# Patient Record
Sex: Female | Born: 1975 | Race: Black or African American | Hispanic: No | Marital: Married | Smoking: Never smoker
Health system: Southern US, Community
[De-identification: ages and names within clinical notes are randomized; demographics above are authoritative.]

## PROBLEM LIST (undated history)

## (undated) DIAGNOSIS — Z789 Other specified health status: Secondary | ICD-10-CM

## (undated) HISTORY — PX: GALLBLADDER SURGERY: SHX652

---

## 2019-01-11 ENCOUNTER — Encounter (HOSPITAL_COMMUNITY): Payer: Self-pay | Admitting: *Deleted

## 2019-01-11 ENCOUNTER — Other Ambulatory Visit: Payer: Self-pay

## 2019-01-11 ENCOUNTER — Inpatient Hospital Stay (HOSPITAL_COMMUNITY)
Admission: AD | Admit: 2019-01-11 | Discharge: 2019-01-12 | Disposition: A | Discharge status: Home / Self Care | Payer: No Typology Code available for payment source | Attending: Obstetrics & Gynecology | Admitting: Obstetrics & Gynecology

## 2019-01-11 DIAGNOSIS — O26891 Other specified pregnancy related conditions, first trimester: Secondary | ICD-10-CM | POA: Insufficient documentation

## 2019-01-11 DIAGNOSIS — Z332 Encounter for elective termination of pregnancy: Secondary | ICD-10-CM | POA: Diagnosis not present

## 2019-01-11 DIAGNOSIS — R42 Dizziness and giddiness: Secondary | ICD-10-CM | POA: Diagnosis not present

## 2019-01-11 DIAGNOSIS — Z3A01 Less than 8 weeks gestation of pregnancy: Secondary | ICD-10-CM | POA: Diagnosis not present

## 2019-01-11 DIAGNOSIS — N939 Abnormal uterine and vaginal bleeding, unspecified: Secondary | ICD-10-CM | POA: Insufficient documentation

## 2019-01-11 DIAGNOSIS — O09521 Supervision of elderly multigravida, first trimester: Secondary | ICD-10-CM | POA: Insufficient documentation

## 2019-01-11 HISTORY — DX: Other specified health status: Z78.9

## 2019-01-11 LAB — POCT PREGNANCY, URINE: Preg Test, Ur: POSITIVE — AB

## 2019-01-11 NOTE — MAU Note (Signed)
Took cytotec Weds and THurs am to terminate pregnancy. Tonight started having a lot of vaginal bleeding and states fainted in bathroom LMP 11/12/18

## 2019-01-12 ENCOUNTER — Encounter (HOSPITAL_COMMUNITY): Payer: Self-pay | Admitting: *Deleted

## 2019-01-12 DIAGNOSIS — Z332 Encounter for elective termination of pregnancy: Secondary | ICD-10-CM

## 2019-01-12 DIAGNOSIS — R42 Dizziness and giddiness: Secondary | ICD-10-CM

## 2019-01-12 DIAGNOSIS — N939 Abnormal uterine and vaginal bleeding, unspecified: Secondary | ICD-10-CM

## 2019-01-12 LAB — CBC
HCT: 28.1 % — ABNORMAL LOW (ref 36.0–46.0)
Hemoglobin: 9.6 g/dL — ABNORMAL LOW (ref 12.0–15.0)
MCH: 29.1 pg (ref 26.0–34.0)
MCHC: 34.2 g/dL (ref 30.0–36.0)
MCV: 85.2 fL (ref 80.0–100.0)
Platelets: 213 10*3/uL (ref 150–400)
RBC: 3.3 MIL/uL — ABNORMAL LOW (ref 3.87–5.11)
RDW: 12.4 % (ref 11.5–15.5)
WBC: 8.1 10*3/uL (ref 4.0–10.5)
nRBC: 0 % (ref 0.0–0.2)

## 2019-01-12 LAB — ABO/RH: ABO/RH(D): O POS

## 2019-01-12 MED ORDER — FERROUS SULFATE 325 (65 FE) MG PO TABS: 325.0000 mg | ORAL_TABLET | Freq: Every day | ORAL | 3 refills | Status: AC

## 2019-01-12 MED ORDER — LACTATED RINGERS IV BOLUS
1000.0000 mL | Freq: Once | INTRAVENOUS | Status: AC
  Administered 2019-01-12: 03:00:00 1000 mL via INTRAVENOUS

## 2019-01-12 NOTE — Progress Notes (Signed)
Written and verbal d/c instructions given and understanding voiced. 

## 2019-01-12 NOTE — MAU Provider Note (Signed)
Chief Complaint: Abdominal Pain and Vaginal Bleeding  First Provider Initiated Contact with Patient 01/12/19 0029     SUBJECTIVE HPI: Cindy Flowers is a 43 y.o. [id number] who presents to maternity admissions reporting bleeding after elective abortion. LMP: 11/12/2018 Patient reports missing period in September and reports pregnancy was unplanned and decided to have an abortion. [facility] 1 tablet of a medication (patient not sure of name, likely Mifeprostone). Went back yesterday and took 8 tablets buccally (likely Cytotec). Reports no symptoms after taking first tablet. Reports cramping started after pills yesterday along with clots. Reports feeling lightheaded yesterday evening with some nausea and dizziness. Cramping and bleeding started at 1200 on 10/29. Reports changing pads about every since then (using "small pads"). At 1900, reported heavier bleeding.  She denies vaginal itching/burning, urinary symptoms, h/a, or fever/chills.    Past Medical History:  Diagnosis Date  . Medical history non-contributory    Past Surgical History:  Procedure Laterality Date  . GALLBLADDER SURGERY     Social History   Socioeconomic History  . Marital status: Married    Spouse name: Not on file  . Number of children: Not on file  . Years of education: Not on file  . Highest education level: Not on file  Occupational History  . Not on file  Social Needs  . Financial resource strain: Not on file  . Food insecurity    Worry: Not on file    Inability: Not on file  . Transportation needs    Medical: Not on file    Non-medical: Not on file  Tobacco Use  . Smoking status: Never Smoker  . Smokeless tobacco: Never Used  Substance and Sexual Activity  . Alcohol use: Never    Frequency: Never  . Drug use: Never  . Sexual activity: Not on file  Lifestyle  . Physical activity    Days per week: Not on file    Minutes per session: Not on file  . Stress:  Not on file  Relationships  . Social Musicianconnections    Talks on phone: Not on file    Gets together: Not on file    Attends religious service: Not on file    Active member of club or organization: Not on file    Attends meetings of clubs or organizations: Not on file    Relationship status: Not on file  . Intimate partner violence    Fear of current or ex partner: Not on file    Emotionally abused: Not on file    Physically abused: Not on file    Forced sexual activity: Not on file  Other Topics Concern  . Not on file  Social History Narrative  . Not on file   No current facility-administered medications on file prior to encounter.    Current Outpatient Medications on File Prior to Encounter  Medication Sig Dispense Refill  . ibuprofen (ADVIL) 800 MG tablet Take 800 mg by mouth every 8 (eight) hours as needed.     No Known Allergies  ROS:  Review of Systems All other systems negative unless noted above in HPI.   I have reviewed patient's Past Medical Hx, Surgical Hx, Family Hx, Social Hx, medications and allergies.   Physical Exam   Patient Vitals for the past 24 hrs:  BP Temp Pulse Resp Height Weight  01/11/19 2347 (!) 104/58 98 F (36.7 C) 75 17 5\' 8"  (1.727 m) 67.1 kg  Constitutional: Well-developed, well-nourished female in no acute distress.  Cardiovascular: normal rate Respiratory: normal effort GI: Abd soft, non-tender.  MS: Extremities nontender, no edema, normal ROM Neurologic: Alert and oriented x 4.  GU: Neg CVAT. PELVIC EXAM: Cervix pink and minimally dilated, without lesion, clots and tissue noted and removed with ring forceps, vaginal walls and external genitalia normal  LAB RESULTS Results for orders placed or performed during the hospital encounter of 01/11/19 (from the past 24 hour(s))  Pregnancy, urine POC     Status: Abnormal   Collection Time: 01/11/19 10:58 PM  Result Value Ref Range   Preg Test, Ur POSITIVE (A) NEGATIVE  ABO/Rh     Status:  None   Collection Time: 01/12/19 12:59 AM  Result Value Ref Range   ABO/RH(D) O POS    No rh immune globuloin      NOT A RH IMMUNE GLOBULIN CANDIDATE, PT RH POSITIVE Performed at [facility], [place] [address], [place], [place] [address]   CBC     Status: Abnormal   Collection Time: 01/12/19  1:02 AM  Result Value Ref Range   WBC 8.1 4.0 - 10.5 K/uL   RBC 3.30 (L) 3.87 - 5.11 MIL/uL   Hemoglobin 9.6 (L) 12.0 - 15.0 g/dL   HCT 28.1 (L) 36.0 - 46.0 %   MCV 85.2 80.0 - 100.0 fL   MCH 29.1 26.0 - 34.0 pg   MCHC 34.2 30.0 - 36.0 g/dL   RDW 12.4 11.5 - 15.5 %   Platelets 213 150 - 400 K/uL   nRBC 0.0 0.0 - 0.2 %    --/--/O POS (10/30 0059)  IMAGING No results found.  MAU Management/MDM: Orders Placed This Encounter  Procedures  . CBC  . Orthostatic vital signs  . Pregnancy, urine POC  . ABO/Rh  . Discharge patient Discharge disposition: 01-Home or Self Care; Discharge patient date: 01/12/2019    Meds ordered this encounter  Medications  . lactated ringers bolus 1,000 mL  . ferrous sulfate 325 (65 FE) MG tablet    Sig: Take 1 tablet (325 mg total) by mouth daily with breakfast.    Dispense:  30 tablet    Refill:  3    ASSESSMENT 1. Elective abortion   2. Vaginal bleeding   3. Orthostatic dizziness    Patient with bleeding after receiving Mifepristone and Cytotec over past 2 days. Bleeding appears normal but will verify patient not with acute anemia. Reports that ultrasound was done at the clinic with documented [redacted]w[redacted]d gestational age. As noted above, speculum exam with clots and fetal tissue which was removed and sent to pathology. CBC and Abo/Rh drawn. O positive blood type. Hgb 9.6; no prior for comparison. Patient not feeling nauseous currently, PO encouraged and patient drank juice in MAU; patient felt better after PO intake. Able to ambulate in MAU without feeling dizzy but obtained orthostatic vitals which unable to complete as patient fainted with brief LOC (did  not hit head, nurses assisted to bed prior to completely falling). 1 Liter LR bolus given. Repeat orthostatics positive by pulse only; BP did not drop and patient asymptomatic. Ferrous sulfate prescribed on discharge. Patient felt comfortable going home and given strict return precautions.   D/w Dr. Harolyn Rutherford.   PLAN Discharge home Allergies as of 01/12/2019   No Known Allergies     Medication List    TAKE these medications   ferrous sulfate 325 (65 FE) MG tablet Take 1 tablet (325 mg total)  by mouth daily with breakfast.   ibuprofen 800 MG tablet Commonly known as: ADVIL Take 800 mg by mouth every 8 (eight) hours as needed.      Follow-up Information    [facility] Follow up.   Specialty: Obstetrics and Gynecology Contact information: [address] [place] [address], [address] [id number] [place] [id number] [phone]          Jerilynn Birkenhead, MD [facility] Fellow, [facility] for [facility], [facility] 01/12/2019  3:22 AM

## 2019-01-12 NOTE — MAU Note (Signed)
Pt states she did not faint and fall but felt light headed so she sat down.

## 2019-01-15 LAB — SURGICAL PATHOLOGY

## 2021-05-28 ENCOUNTER — Other Ambulatory Visit: Payer: Self-pay | Admitting: Obstetrics and Gynecology

## 2021-06-13 HISTORY — PX: BREAST BIOPSY: SHX20

## 2021-06-25 ENCOUNTER — Ambulatory Visit
Admission: RE | Admit: 2021-06-25 | Discharge: 2021-06-25 | Disposition: A | Discharge status: Home / Self Care | Payer: 59 | Source: Ambulatory Visit | Attending: Obstetrics and Gynecology | Admitting: Obstetrics and Gynecology

## 2021-06-25 ENCOUNTER — Other Ambulatory Visit: Payer: Self-pay | Admitting: Obstetrics and Gynecology

## 2021-06-25 DIAGNOSIS — R928 Other abnormal and inconclusive findings on diagnostic imaging of breast: Secondary | ICD-10-CM

## 2021-06-25 DIAGNOSIS — N6310 Unspecified lump in the right breast, unspecified quadrant: Secondary | ICD-10-CM

## 2021-07-08 ENCOUNTER — Ambulatory Visit
Admission: RE | Admit: 2021-07-08 | Discharge: 2021-07-08 | Disposition: A | Discharge status: Home / Self Care | Payer: 59 | Source: Ambulatory Visit | Attending: Obstetrics and Gynecology | Admitting: Obstetrics and Gynecology

## 2021-07-08 DIAGNOSIS — N6310 Unspecified lump in the right breast, unspecified quadrant: Secondary | ICD-10-CM

## 2022-05-07 ENCOUNTER — Other Ambulatory Visit: Payer: Self-pay

## 2022-05-07 ENCOUNTER — Emergency Department (HOSPITAL_COMMUNITY): Payer: 59

## 2022-05-07 ENCOUNTER — Encounter (HOSPITAL_COMMUNITY): Payer: Self-pay

## 2022-05-07 ENCOUNTER — Emergency Department (HOSPITAL_COMMUNITY)
Admission: EM | Admit: 2022-05-07 | Discharge: 2022-05-08 | Disposition: A | Discharge status: Home / Self Care | Payer: 59 | Attending: Emergency Medicine | Admitting: Emergency Medicine

## 2022-05-07 DIAGNOSIS — S39012A Strain of muscle, fascia and tendon of lower back, initial encounter: Secondary | ICD-10-CM

## 2022-05-07 DIAGNOSIS — R0602 Shortness of breath: Secondary | ICD-10-CM | POA: Insufficient documentation

## 2022-05-07 DIAGNOSIS — M545 Low back pain, unspecified: Secondary | ICD-10-CM | POA: Insufficient documentation

## 2022-05-07 DIAGNOSIS — R109 Unspecified abdominal pain: Secondary | ICD-10-CM | POA: Insufficient documentation

## 2022-05-07 LAB — CBC WITH DIFFERENTIAL/PLATELET
Abs Immature Granulocytes: 0.01 10*3/uL (ref 0.00–0.07)
Basophils Absolute: 0.1 10*3/uL (ref 0.0–0.1)
Basophils Relative: 1 %
Eosinophils Absolute: 0.2 10*3/uL (ref 0.0–0.5)
Eosinophils Relative: 3 %
HCT: 36.3 % (ref 36.0–46.0)
Hemoglobin: 11.8 g/dL — ABNORMAL LOW (ref 12.0–15.0)
Immature Granulocytes: 0 %
Lymphocytes Relative: 34 %
Lymphs Abs: 2.4 10*3/uL (ref 0.7–4.0)
MCH: 28.4 pg (ref 26.0–34.0)
MCHC: 32.5 g/dL (ref 30.0–36.0)
MCV: 87.3 fL (ref 80.0–100.0)
Monocytes Absolute: 0.7 10*3/uL (ref 0.1–1.0)
Monocytes Relative: 10 %
Neutro Abs: 3.7 10*3/uL (ref 1.7–7.7)
Neutrophils Relative %: 52 %
Platelets: 260 10*3/uL (ref 150–400)
RBC: 4.16 MIL/uL (ref 3.87–5.11)
RDW: 12.3 % (ref 11.5–15.5)
WBC: 7 10*3/uL (ref 4.0–10.5)
nRBC: 0 % (ref 0.0–0.2)

## 2022-05-07 LAB — URINALYSIS, ROUTINE W REFLEX MICROSCOPIC
Bilirubin Urine: NEGATIVE
Glucose, UA: NEGATIVE mg/dL
Hgb urine dipstick: NEGATIVE
Ketones, ur: NEGATIVE mg/dL
Nitrite: NEGATIVE
Protein, ur: NEGATIVE mg/dL
Specific Gravity, Urine: 1.004 — ABNORMAL LOW (ref 1.005–1.030)
pH: 8 (ref 5.0–8.0)

## 2022-05-07 LAB — COMPREHENSIVE METABOLIC PANEL
ALT: 28 U/L (ref 0–44)
AST: 23 U/L (ref 15–41)
Albumin: 3.8 g/dL (ref 3.5–5.0)
Alkaline Phosphatase: 56 U/L (ref 38–126)
Anion gap: 6 (ref 5–15)
BUN: 15 mg/dL (ref 6–20)
CO2: 22 mmol/L (ref 22–32)
Calcium: 8.6 mg/dL — ABNORMAL LOW (ref 8.9–10.3)
Chloride: 107 mmol/L (ref 98–111)
Creatinine, Ser: 0.65 mg/dL (ref 0.44–1.00)
GFR, Estimated: >60 mL/min (ref >60)
Glucose, Bld: 96 mg/dL (ref 70–99)
Potassium: 3.4 mmol/L — ABNORMAL LOW (ref 3.5–5.1)
Sodium: 135 mmol/L (ref 135–145)
Total Bilirubin: 0.5 mg/dL (ref 0.3–1.2)
Total Protein: 7 g/dL (ref 6.5–8.1)

## 2022-05-07 LAB — HCG, QUANTITATIVE, PREGNANCY: hCG, Beta Chain, Quant, S: 2 m[IU]/mL (ref <5)

## 2022-05-07 LAB — LIPASE, BLOOD: Lipase: 43 U/L (ref 11–51)

## 2022-05-07 MED ORDER — ONDANSETRON 4 MG PO TBDP
4.0000 mg | ORAL_TABLET | Freq: Once | ORAL | Status: AC
  Administered 2022-05-07: 4 mg via ORAL
  Filled 2022-05-07: qty 1

## 2022-05-07 MED ORDER — ACETAMINOPHEN 500 MG PO TABS
1000.0000 mg | ORAL_TABLET | Freq: Once | ORAL | Status: AC
  Administered 2022-05-07: 1000 mg via ORAL
  Filled 2022-05-07: qty 2

## 2022-05-07 MED ORDER — IOHEXOL 300 MG/ML  SOLN
100.0000 mL | Freq: Once | INTRAMUSCULAR | Status: AC | PRN
  Administered 2022-05-07: 100 mL via INTRAVENOUS

## 2022-05-07 MED ORDER — SODIUM CHLORIDE 0.9 % IV BOLUS
1000.0000 mL | Freq: Once | INTRAVENOUS | Status: AC
  Administered 2022-05-07: 1000 mL via INTRAVENOUS

## 2022-05-07 NOTE — ED Provider Triage Note (Signed)
Emergency Medicine Provider Triage Evaluation Note  Leeah Loberg , a 47 y.o. female  was evaluated in triage.  Pt complains of abdominal pain that began last night. Describes the pain in her right and lower abdomen and back. Pain is sharp. It is constant. States that she was at work and could not get comfortable sitting or standing. She went to PCP and told her to take ibuprofen with dinner tonight. She states that after dinner she felt even worse. Having nausea without vomiting. Denies fever, diarrhea, dysuria.   Review of Systems  Positive: See above Negative:   Physical Exam  BP 127/82   Pulse 78   Temp (!) 97.5 F (36.4 C) (Oral)   Resp 17   SpO2 95%  Gen:   Awake, no distress   Resp:  Normal effort  MSK:   Moves extremities without difficulty  Other:  Generalized abdominal TTP. Abdomen is soft.   Medical Decision Making  Medically screening exam initiated at 7:57 PM.  Appropriate orders placed.  Oneida Arenas Billey was informed that the remainder of the evaluation will be completed by another provider, this initial triage assessment does not replace that evaluation, and the importance of remaining in the ED until their evaluation is complete.     Mickie Hillier, PA-C 05/07/22 2001

## 2022-05-07 NOTE — ED Triage Notes (Signed)
Pt with onset of back pain last night.  This morning she felt "shaky" and had back pain and went to lie down but went to work.  States at work she was unable to sit still d/t pain.  Pt reports pain radiating around to abdomen.  Went to see her PCP and was given muscle relaxer and told to take this as well as ibuprofen after dinner tonight. Pt reports after taking these the pain was even worse.

## 2022-05-07 NOTE — ED Provider Notes (Signed)
[facility] Provider Note   CSN: [id number] Arrival date & time: 05/07/22  1929     History  Chief Complaint  Patient presents with   Back Pain   Flank Pain    Cindy Flowers is a 47 y.o. female who presented with right-sided low back pain and right flank pain that began last night.  Patient states she took ibuprofen after dinner and the pain got worse.  Patient at work she was unable to sit due to pain and not lying down does not relieve pain.  Patient states back pain is then the low back region in the paraspinal muscles and radiates around her flank into her abdomen and chest which makes her short of breath.  Patient had been prescribed muscle relaxers but had not had the chance to take it yet.  Patient denied recent illness, fevers, nausea/vomiting, dysuria, saddle anesthesia, new onset weakness, changes in sensation, headaches, vision changes, diarrhea, hematuria, urinary or bowel changes   Home Medications Prior to Admission medications   Medication Sig Start Date End Date Taking? Authorizing Provider  ferrous sulfate 325 (65 FE) MG tablet Take 1 tablet (325 mg total) by mouth daily with breakfast. 01/12/19 05/12/19  Fair, Marin Shutter, MD  ibuprofen (ADVIL) 800 MG tablet Take 800 mg by mouth every 8 (eight) hours as needed.    [provider]      Allergies    Patient has no known allergies.    Review of Systems   Review of Systems  Genitourinary:  Positive for flank pain.  Musculoskeletal:  Positive for back pain.  See HPI  Physical Exam Updated Vital Signs BP 127/82   Pulse 78   Temp (!) 97.5 F (36.4 C) (Oral)   Resp 17   SpO2 95%  Physical Exam Vitals and nursing note reviewed.  Constitutional:      General: She is not in acute distress.    Appearance: She is well-developed.  HENT:     Head: Normocephalic and atraumatic.  Eyes:     Conjunctiva/sclera: Conjunctivae normal.  Cardiovascular:      Rate and Rhythm: Normal rate and regular rhythm.     Pulses: Normal pulses.     Heart sounds: No murmur heard.    Comments: 2+ bilateral radial/dorsalis pedis pulses with regular rate  Pulmonary:     Effort: Pulmonary effort is normal. No respiratory distress.     Breath sounds: Normal breath sounds.  Abdominal:     Palpations: Abdomen is soft. There is no mass.     Tenderness: There is abdominal tenderness (generally, described as discomfort). There is no right CVA tenderness, left CVA tenderness, guarding or rebound.  Musculoskeletal:        General: No swelling.     Cervical back: Normal range of motion and neck supple.     Comments: No midline tenderness and no step-off/crepitus/abnormalities palpated Right-sided paraspinal tenderness  Skin:    General: Skin is warm and dry.     Capillary Refill: Capillary refill takes less than 2 seconds.     Comments: No lesions noted or rash  Neurological:     General: No focal deficit present.     Mental Status: She is alert and oriented to person, place, and time.     Comments: Sensation intact distally in all 4 limbs Patient is able to range ankles and knees however this exacerbates back pain  Psychiatric:  Mood and Affect: Mood normal.     ED Results / Procedures / Treatments   Labs (all labs ordered are listed, but only abnormal results are displayed) Labs Reviewed  COMPREHENSIVE METABOLIC PANEL - Abnormal; Notable for the following components:      Result Value   Potassium 3.4 (*)    Calcium 8.6 (*)    All other components within normal limits  CBC WITH DIFFERENTIAL/PLATELET - Abnormal; Notable for the following components:   Hemoglobin 11.8 (*)    All other components within normal limits  LIPASE, BLOOD  URINALYSIS, ROUTINE W REFLEX MICROSCOPIC  HCG, QUANTITATIVE, PREGNANCY    EKG None  Radiology No results found.  Procedures Procedures    Medications Ordered in ED Medications  ondansetron (ZOFRAN-ODT)  disintegrating tablet 4 mg (4 mg Oral Given 05/07/22 2155)  acetaminophen (TYLENOL) tablet 1,000 mg (1,000 mg Oral Given 05/07/22 2155)  sodium chloride 0.9 % bolus 1,000 mL (1,000 mLs Intravenous New Bag/Given 05/07/22 2159)    ED Course/ Medical Decision Making/ A&P                             Medical Decision Making Amount and/or Complexity of Data Reviewed Labs: ordered.   Cindy Flowers 47 y.o. presented today for back pain. Working DDx that I considered at this time includes, but not limited to, cauda equina, muscle strain, nephrolithiasis, pyelonephritis, vertebral fracture.  Review of prior external notes: None  Unique Tests and My Interpretation:  CBC: Unremarkable hCG quantitative: CMP: Unremarkable Lipase: Unremarkable UA: Pending CT abdomen pelvis with contrast: Pending EKG: Sinus 69 bpm with a first-degree heart block, no ST abnormalities noted  Discussion with Independent Historian: Husband  Discussion of Management of Tests: None  Risk: Cannot be determined at this time  Risk Stratification Score: None  R/o DDx: Cannot be determined at this time  Plan: Patient presented for back pain.  Patient appears uncomfortable during exam but not in distress.  Patient's vitals are stable.  Labs and imaging were ordered along with Tylenol and Zofran for symptom management at this time.  Patient's blood pressure was 104/58 in the room and so a liter of fluids were ordered as well.  Patient was signed out to oncoming team at 2215. Patient stable at this time.        Final Clinical Impression(s) / ED Diagnoses Final diagnoses:  None    Rx / DC Orders ED Discharge Orders     None         Elvina Sidle 05/07/22 2215    Charlesetta Shanks, MD 05/11/22 1444

## 2022-05-08 DIAGNOSIS — M545 Low back pain, unspecified: Secondary | ICD-10-CM | POA: Diagnosis not present

## 2022-05-08 MED ORDER — LIDOCAINE 5 % EX PTCH
1.0000 | MEDICATED_PATCH | CUTANEOUS | Status: DC
  Administered 2022-05-08: 1 via TRANSDERMAL
  Filled 2022-05-08: qty 1

## 2022-05-08 MED ORDER — KETOROLAC TROMETHAMINE 15 MG/ML IJ SOLN
15.0000 mg | Freq: Once | INTRAMUSCULAR | Status: AC
  Administered 2022-05-08: 15 mg via INTRAVENOUS
  Filled 2022-05-08: qty 1

## 2022-05-08 NOTE — ED Provider Notes (Signed)
12:15 AM Care assumed from [place], PA-C at change of shift.  In short, patient presenting for right-sided flank pain and low back pain which began last night.  Pain has been unrelieved despite ibuprofen.  She did see her primary care doctor who prescribed muscle relaxers, but she has not yet taken this medication.  I have reviewed the patient's workup in the ED.  She has no leukocytosis or fever.  No criteria for SIRS/sepsis.  Metabolic panel reassuring without electrolyte derangement.  Liver and kidney function preserved.  Urinalysis is notable for bacteriuria, but minimal pyuria.  She denies any urinary symptoms including urgency, frequency, dysuria.  Doubt UTI.  I have viewed and interpreted the patient's CT scan which is notable only for bilateral simple ovarian cysts.  Do not feel this is contributing to the patient's symptoms.  Given reproducible tenderness on exam, likely musculoskeletal strain.  I have counseled the patient on supportive treatment as well as continued primary care follow-up.  Return precautions discussed and provided. Patient discharged in stable condition with no unaddressed concerns.   Results for orders placed or performed during the hospital encounter of 05/07/22  Comprehensive metabolic panel  Result Value Ref Range   Sodium 135 135 - 145 mmol/L   Potassium 3.4 (L) 3.5 - 5.1 mmol/L   Chloride 107 98 - 111 mmol/L   CO2 22 22 - 32 mmol/L   Glucose, Bld 96 70 - 99 mg/dL   BUN 15 6 - 20 mg/dL   Creatinine, Ser 0.65 0.44 - 1.00 mg/dL   Calcium 8.6 (L) 8.9 - 10.3 mg/dL   Total Protein 7.0 6.5 - 8.1 g/dL   Albumin 3.8 3.5 - 5.0 g/dL   AST 23 15 - 41 U/L   ALT 28 0 - 44 U/L   Alkaline Phosphatase 56 38 - 126 U/L   Total Bilirubin 0.5 0.3 - 1.2 mg/dL   GFR, Estimated >60 >60 mL/min   Anion gap 6 5 - 15  Lipase, blood  Result Value Ref Range   Lipase 43 11 - 51 U/L  CBC with Differential  Result Value Ref Range   WBC 7.0 4.0 - 10.5 K/uL   RBC 4.16 3.87 - 5.11  MIL/uL   Hemoglobin 11.8 (L) 12.0 - 15.0 g/dL   HCT 36.3 36.0 - 46.0 %   MCV 87.3 80.0 - 100.0 fL   MCH 28.4 26.0 - 34.0 pg   MCHC 32.5 30.0 - 36.0 g/dL   RDW 12.3 11.5 - 15.5 %   Platelets 260 150 - 400 K/uL   nRBC 0.0 0.0 - 0.2 %   Neutrophils Relative % 52 %   Neutro Abs 3.7 1.7 - 7.7 K/uL   Lymphocytes Relative 34 %   Lymphs Abs 2.4 0.7 - 4.0 K/uL   Monocytes Relative 10 %   Monocytes Absolute 0.7 0.1 - 1.0 K/uL   Eosinophils Relative 3 %   Eosinophils Absolute 0.2 0.0 - 0.5 K/uL   Basophils Relative 1 %   Basophils Absolute 0.1 0.0 - 0.1 K/uL   Immature Granulocytes 0 %   Abs Immature Granulocytes 0.01 0.00 - 0.07 K/uL  Urinalysis, Routine w reflex microscopic -Urine, Clean Catch  Result Value Ref Range   Color, Urine STRAW (A) YELLOW   APPearance CLEAR CLEAR   Specific Gravity, Urine 1.004 (L) 1.005 - 1.030   pH 8.0 5.0 - 8.0   Glucose, UA NEGATIVE NEGATIVE mg/dL   Hgb urine dipstick NEGATIVE NEGATIVE   Bilirubin Urine NEGATIVE  NEGATIVE   Ketones, ur NEGATIVE NEGATIVE mg/dL   Protein, ur NEGATIVE NEGATIVE mg/dL   Nitrite NEGATIVE NEGATIVE   Leukocytes,Ua TRACE (A) NEGATIVE   RBC / HPF 0-5 0 - 5 RBC/hpf   WBC, UA 6-10 0 - 5 WBC/hpf   Bacteria, UA MANY (A) NONE SEEN   Squamous Epithelial / HPF 0-5 0 - 5 /HPF  hCG, quantitative, pregnancy  Result Value Ref Range   hCG, Beta Chain, Quant, S 2 <5 mIU/mL   CT ABDOMEN PELVIS W CONTRAST  Result Date: 05/07/2022 CLINICAL DATA:  Lower abdominal pain. EXAM: CT ABDOMEN AND PELVIS WITH CONTRAST TECHNIQUE: Multidetector CT imaging of the abdomen and pelvis was performed using the standard protocol following bolus administration of intravenous contrast. RADIATION DOSE REDUCTION: This exam was performed according to the departmental dose-optimization program which includes automated exposure control, adjustment of the mA and/or kV according to patient size and/or use of iterative reconstruction technique. CONTRAST:  110m OMNIPAQUE  IOHEXOL 300 MG/ML  SOLN COMPARISON:  None Available. FINDINGS: Lower chest: Mild anterolateral right middle lobe and posterior bibasilar atelectasis is noted. Hepatobiliary: No focal liver abnormality is seen. The gallbladder is not identified. There is no evidence of biliary dilatation. Pancreas: Unremarkable. No pancreatic ductal dilatation or surrounding inflammatory changes. Spleen: Normal in size without focal abnormality. Adrenals/Urinary Tract: Adrenal glands are unremarkable. Kidneys are normal, without renal calculi, focal lesion, or hydronephrosis. Bladder is unremarkable. Stomach/Bowel: Stomach is within normal limits. Appendix appears normal. No evidence of bowel wall thickening, distention, or inflammatory changes. Vascular/Lymphatic: No significant vascular findings are present. No enlarged abdominal or pelvic lymph nodes. Reproductive: Tortuous vessels are seen along the lateral aspects of an otherwise normal appearing uterus. A 3.5 cm x 2.9 cm x 3.6 cm and 1.7 cm x 1.8 cm x 1.4 cm simple cysts are seen within the right adnexa. Additional smaller cystic appearing areas are noted within the left adnexa. Other: No abdominal wall hernia or abnormality. No abdominopelvic ascites. Musculoskeletal: No acute or significant osseous findings. IMPRESSION: 1. Bilateral simple ovarian cysts, right greater than left. Follow-up with nonemergent pelvic ultrasound is recommended. This recommendation follows ACR consensus guidelines: White Paper of the ACR Incidental Findings Committee II on Adnexal Findings. J Am Coll Radiol 2013:10:675-681. 2. Tortuous vessels along the lateral aspects of the uterus which can be seen in the setting of pelvic congestion syndrome. Electronically Signed   By: TVirgina NorfolkM.D.   On: 05/07/2022 23:59      HAntonietta Breach PHershal Coria02/24/24 0113    RQuintella Reichert MD 05/08/22 [phone]

## 2022-05-08 NOTE — Discharge Instructions (Addendum)
Your evaluation in the ED today was reassuring.  Alternate ice and heat to areas of injury 3-4 times per day to limit inflammation and spasm.  Avoid strenuous activity and heavy lifting.  We recommend Aleve twice a day for management of pain in addition to the muscle relaxer prescribed by your primary care doctor.  Do not drive or drink alcohol after taking a muscle relaxer as it may make you drowsy and impair your judgment.  We recommend recheck with your primary care doctor to ensure resolution of symptoms.  Return to the ED for any new or concerning symptoms.

## 2022-07-27 ENCOUNTER — Other Ambulatory Visit: Payer: Self-pay

## 2022-07-27 ENCOUNTER — Emergency Department (HOSPITAL_COMMUNITY): Payer: 59

## 2022-07-27 ENCOUNTER — Encounter (HOSPITAL_COMMUNITY): Payer: Self-pay

## 2022-07-27 ENCOUNTER — Emergency Department (HOSPITAL_COMMUNITY)
Admission: EM | Admit: 2022-07-27 | Discharge: 2022-07-27 | Disposition: A | Discharge status: Home / Self Care | Payer: 59 | Attending: Emergency Medicine | Admitting: Emergency Medicine

## 2022-07-27 DIAGNOSIS — Y92410 Unspecified street and highway as the place of occurrence of the external cause: Secondary | ICD-10-CM | POA: Diagnosis not present

## 2022-07-27 DIAGNOSIS — I517 Cardiomegaly: Secondary | ICD-10-CM | POA: Diagnosis not present

## 2022-07-27 DIAGNOSIS — S20212A Contusion of left front wall of thorax, initial encounter: Secondary | ICD-10-CM | POA: Diagnosis not present

## 2022-07-27 DIAGNOSIS — R0789 Other chest pain: Secondary | ICD-10-CM | POA: Diagnosis present

## 2022-07-27 LAB — CBC
HCT: 37.6 % (ref 36.0–46.0)
Hemoglobin: 12.2 g/dL (ref 12.0–15.0)
MCH: 28 pg (ref 26.0–34.0)
MCHC: 32.4 g/dL (ref 30.0–36.0)
MCV: 86.4 fL (ref 80.0–100.0)
Platelets: 236 10*3/uL (ref 150–400)
RBC: 4.35 MIL/uL (ref 3.87–5.11)
RDW: 12.9 % (ref 11.5–15.5)
WBC: 7 10*3/uL (ref 4.0–10.5)
nRBC: 0 % (ref 0.0–0.2)

## 2022-07-27 LAB — BASIC METABOLIC PANEL
Anion gap: 5 (ref 5–15)
BUN: 9 mg/dL (ref 6–20)
CO2: 25 mmol/L (ref 22–32)
Calcium: 9.1 mg/dL (ref 8.9–10.3)
Chloride: 105 mmol/L (ref 98–111)
Creatinine, Ser: 0.58 mg/dL (ref 0.44–1.00)
GFR, Estimated: >60 mL/min (ref >60)
Glucose, Bld: 114 mg/dL — ABNORMAL HIGH (ref 70–99)
Potassium: 4.2 mmol/L (ref 3.5–5.1)
Sodium: 135 mmol/L (ref 135–145)

## 2022-07-27 LAB — I-STAT BETA HCG BLOOD, ED (MC, WL, AP ONLY): I-stat hCG, quantitative: <5 m[IU]/mL (ref <5)

## 2022-07-27 LAB — TROPONIN I (HIGH SENSITIVITY): Troponin I (High Sensitivity): 3 ng/L (ref <18)

## 2022-07-27 MED ORDER — CYCLOBENZAPRINE HCL 10 MG PO TABS: 10.0000 mg | ORAL_TABLET | Freq: Three times a day (TID) | ORAL | 0 refills | Status: AC | PRN

## 2022-07-27 MED ORDER — IBUPROFEN 600 MG PO TABS: 600.0000 mg | ORAL_TABLET | Freq: Three times a day (TID) | ORAL | 0 refills | Status: AC | PRN
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
## 2022-08-02 ENCOUNTER — Ambulatory Visit (INDEPENDENT_AMBULATORY_CARE_PROVIDER_SITE_OTHER): Payer: 59 | Admitting: Obstetrics and Gynecology

## 2022-08-02 ENCOUNTER — Ambulatory Visit
Admission: RE | Admit: 2022-08-02 | Discharge: 2022-08-02 | Disposition: A | Discharge status: Home / Self Care | Payer: 59 | Source: Ambulatory Visit | Attending: Obstetrics and Gynecology | Admitting: Obstetrics and Gynecology

## 2022-08-02 ENCOUNTER — Other Ambulatory Visit (HOSPITAL_COMMUNITY)
Admission: RE | Admit: 2022-08-02 | Discharge: 2022-08-02 | Disposition: A | Discharge status: Home / Self Care | Payer: 59 | Source: Ambulatory Visit | Attending: Obstetrics and Gynecology | Admitting: Obstetrics and Gynecology

## 2022-08-02 ENCOUNTER — Encounter: Payer: Self-pay | Admitting: Obstetrics and Gynecology

## 2022-08-02 VITALS — BP 115/81 | HR 60 | Ht 67.0 in | Wt 182.0 lb

## 2022-08-02 DIAGNOSIS — Z01419 Encounter for gynecological examination (general) (routine) without abnormal findings: Secondary | ICD-10-CM

## 2022-08-02 DIAGNOSIS — Z1339 Encounter for screening examination for other mental health and behavioral disorders: Secondary | ICD-10-CM

## 2022-08-02 NOTE — Progress Notes (Signed)
Subjective:     Cindy Flowers is a 47 y.o. female P3 with LMP 07/02/22 and BMI 28 here for a routine exam.  Current complaints: none.  Patient reports a monthly period lasting 3-4 days. She denies pelvic pain or abnormal discharge. She is sexually active using nothing for contraception. She would welcome a pregnancy if it were to occur. She denies any urinary incontinence. She reports regular bowel movement.    Gynecologic History No LMP recorded. Contraception: none Last Pap: 2021. Results were: normal Last mammogram: 06/2021. Results were: abnormal with normal biopsy  Obstetric History OB History  Gravida Para Term Preterm AB Living  4 3 3     3   SAB IAB Ectopic Multiple Live Births               # Outcome Date GA Lbr Len/2nd Weight Sex Delivery Anes PTL Lv  4 Gravida           3 Term      Vag-Spont     2 Term      Vag-Spont     1 Term      Vag-Spont      Past Medical History:  Diagnosis Date   Medical history non-contributory    Past Surgical History:  Procedure Laterality Date   GALLBLADDER SURGERY     History reviewed. No pertinent family history. Social History   Tobacco Use   Smoking status: Never   Smokeless tobacco: Never  Vaping Use   Vaping Use: Never used  Substance Use Topics   Alcohol use: Never   Drug use: Never       Review of Systems Pertinent items noted in HPI and remainder of comprehensive ROS otherwise negative.    Objective:  Blood pressure 115/81, pulse 60, height 5\' 7"  (1.702 m), weight 182 lb (82.6 kg), last menstrual period 07/02/2022, unknown if currently breastfeeding.   GENERAL: Well-developed, well-nourished female in no acute distress.  HEENT: Normocephalic, atraumatic. Sclerae anicteric.  NECK: Supple. Normal thyroid.  LUNGS: Clear to auscultation bilaterally.  HEART: Regular rate and rhythm. BREASTS: Symmetric in size. No palpable masses or lymphadenopathy, skin changes, or nipple drainage. ABDOMEN: Soft, nontender,  nondistended. No organomegaly. PELVIC: Normal external female genitalia. Vagina is pink and rugated.  Normal discharge. Normal appearing cervix. Uterus is normal in size. No adnexal mass or tenderness. Chaperone present during the pelvic exam EXTREMITIES: No cyanosis, clubbing, or edema, 2+ distal pulses.     Assessment:    Healthy female exam.    Plan:    Pap smear collected Screening mammogram ordered Health maintenance labs ordered Patient will be contacted with abnormal results Colonoscopy discussed and patient referred to GI Patient advised to take prenatal vitamins in the event of a pregnancy

## 2022-08-02 NOTE — Progress Notes (Signed)
Pt states she had GYN exam last year with pap.  Mammo last year.  Pt would like all std screening.

## 2022-08-03 LAB — COMPREHENSIVE METABOLIC PANEL
ALT: 21 IU/L (ref 0–32)
AST: 18 IU/L (ref 0–40)
Albumin/Globulin Ratio: 1.7 (ref 1.2–2.2)
Albumin: 4.3 g/dL (ref 3.9–4.9)
Alkaline Phosphatase: 71 IU/L (ref 44–121)
BUN/Creatinine Ratio: 13 (ref 9–23)
BUN: 8 mg/dL (ref 6–24)
Bilirubin Total: 0.3 mg/dL (ref 0.0–1.2)
CO2: 22 mmol/L (ref 20–29)
Calcium: 9.1 mg/dL (ref 8.7–10.2)
Chloride: 102 mmol/L (ref 96–106)
Creatinine, Ser: 0.64 mg/dL (ref 0.57–1.00)
Globulin, Total: 2.6 g/dL (ref 1.5–4.5)
Glucose: 84 mg/dL (ref 70–99)
Potassium: 4.1 mmol/L (ref 3.5–5.2)
Sodium: 137 mmol/L (ref 134–144)
Total Protein: 6.9 g/dL (ref 6.0–8.5)
eGFR: 111 mL/min/{1.73_m2} (ref >59)

## 2022-08-03 LAB — CERVICOVAGINAL ANCILLARY ONLY
Chlamydia: NEGATIVE
Comment: NEGATIVE
Comment: NORMAL
Neisseria Gonorrhea: NEGATIVE

## 2022-08-03 LAB — CBC
Hematocrit: 37.7 % (ref 34.0–46.6)
Hemoglobin: 12.4 g/dL (ref 11.1–15.9)
MCH: 28.6 pg (ref 26.6–33.0)
MCHC: 32.9 g/dL (ref 31.5–35.7)
MCV: 87 fL (ref 79–97)
Platelets: 251 10*3/uL (ref 150–450)
RBC: 4.34 x10E6/uL (ref 3.77–5.28)
RDW: 12.6 % (ref 11.7–15.4)
WBC: 5.8 10*3/uL (ref 3.4–10.8)

## 2022-08-03 LAB — TSH: TSH: 3.1 u[IU]/mL (ref 0.450–4.500)

## 2022-08-03 LAB — RPR: RPR Ser Ql: NONREACTIVE

## 2022-08-03 LAB — HEMOGLOBIN A1C
Est. average glucose Bld gHb Est-mCnc: 100 mg/dL
Hgb A1c MFr Bld: 5.1 % (ref 4.8–5.6)

## 2022-08-03 LAB — HEPATITIS B SURFACE ANTIGEN: Hepatitis B Surface Ag: NEGATIVE

## 2022-08-03 LAB — HIV ANTIBODY (ROUTINE TESTING W REFLEX): HIV Screen 4th Generation wRfx: NONREACTIVE

## 2022-08-03 LAB — HEPATITIS C ANTIBODY: Hep C Virus Ab: NONREACTIVE

## 2022-08-03 LAB — LIPID PANEL
Chol/HDL Ratio: 4 ratio (ref 0.0–4.4)
Cholesterol, Total: 214 mg/dL — ABNORMAL HIGH (ref 100–199)
HDL: 54 mg/dL (ref >39)
LDL Chol Calc (NIH): 140 mg/dL — ABNORMAL HIGH (ref 0–99)
Triglycerides: 113 mg/dL (ref 0–149)
VLDL Cholesterol Cal: 20 mg/dL (ref 5–40)

## 2022-08-05 LAB — CYTOLOGY - PAP
Comment: NEGATIVE
Comment: NEGATIVE
Comment: NEGATIVE
HPV 16: NEGATIVE
HPV 18 / 45: NEGATIVE
High risk HPV: POSITIVE — AB

## 2022-08-06 ENCOUNTER — Encounter: Payer: Self-pay | Admitting: Obstetrics & Gynecology

## 2022-08-06 DIAGNOSIS — R87612 Low grade squamous intraepithelial lesion on cytologic smear of cervix (LGSIL): Secondary | ICD-10-CM | POA: Insufficient documentation

## 2022-08-10 ENCOUNTER — Encounter: Payer: Self-pay | Admitting: Family Medicine

## 2022-08-16 ENCOUNTER — Encounter (HOSPITAL_COMMUNITY): Payer: Self-pay | Admitting: Family Medicine

## 2022-08-16 ENCOUNTER — Encounter (HOSPITAL_BASED_OUTPATIENT_CLINIC_OR_DEPARTMENT_OTHER): Payer: Self-pay | Admitting: Family Medicine

## 2022-08-16 ENCOUNTER — Other Ambulatory Visit (HOSPITAL_COMMUNITY): Payer: Self-pay | Admitting: Family Medicine

## 2022-08-16 DIAGNOSIS — R1032 Left lower quadrant pain: Secondary | ICD-10-CM

## 2022-08-17 ENCOUNTER — Ambulatory Visit (HOSPITAL_COMMUNITY)
Admission: RE | Admit: 2022-08-17 | Discharge: 2022-08-17 | Disposition: A | Discharge status: Home / Self Care | Payer: 59 | Source: Ambulatory Visit | Attending: Family Medicine | Admitting: Family Medicine

## 2022-08-17 DIAGNOSIS — R1032 Left lower quadrant pain: Secondary | ICD-10-CM | POA: Diagnosis present

## 2022-09-30 ENCOUNTER — Other Ambulatory Visit (HOSPITAL_COMMUNITY)
Admission: RE | Admit: 2022-09-30 | Discharge: 2022-09-30 | Disposition: A | Discharge status: Home / Self Care | Payer: 59 | Source: Ambulatory Visit | Attending: Obstetrics & Gynecology | Admitting: Obstetrics & Gynecology

## 2022-09-30 ENCOUNTER — Ambulatory Visit (INDEPENDENT_AMBULATORY_CARE_PROVIDER_SITE_OTHER): Payer: 59 | Admitting: Obstetrics & Gynecology

## 2022-09-30 VITALS — BP 122/81 | HR 58 | Ht 69.0 in | Wt 177.1 lb

## 2022-09-30 DIAGNOSIS — R87612 Low grade squamous intraepithelial lesion on cytologic smear of cervix (LGSIL): Secondary | ICD-10-CM

## 2022-09-30 NOTE — Progress Notes (Signed)
Pt presents for Colpo.  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

## 2022-09-30 NOTE — Progress Notes (Signed)
Patient ID: Cindy Flowers, female   DOB: [birth year], 47 y.o.   MRN: [id number]  Chief Complaint  Patient presents with   Procedure    HPI Drinda Belgard is a 47 y.o. female.  [id number] Patient's last menstrual period was 09/24/2022.  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 Previous colposcopy: no. Prior cervical treatment: no treatment.  Past Medical History:  Diagnosis Date   Medical history non-contributory     Past Surgical History:  Procedure Laterality Date   BREAST BIOPSY Right 06/2021   GALLBLADDER SURGERY      No family history on file.  Social History Social History   Tobacco Use   Smoking status: Never   Smokeless tobacco: Never  Vaping Use   Vaping status: Never Used  Substance Use Topics   Alcohol use: Never   Drug use: Never    No Known Allergies  Current Outpatient Medications  Medication Sig Dispense Refill   ferrous sulfate 325 (65 FE) MG tablet Take 1 tablet (325 mg total) by mouth daily with breakfast. 30 tablet 3   No current facility-administered medications for this visit.    Review of Systems Review of Systems  All other systems reviewed and are negative.   Blood pressure 122/81, pulse (!) 58, height 5\' 9"  (1.753 m), weight 177 lb 1.6 oz (80.3 kg), last menstrual period 09/24/2022, not currently breastfeeding.  Physical Exam Physical Exam Vitals and nursing note reviewed. Exam conducted with a chaperone present.  Constitutional:      Appearance: Normal appearance.  Genitourinary:    General: Normal vulva.     Exam position: Lithotomy position.     Vagina: Normal.     Cervix: Normal.  Neurological:     Mental Status: She is alert.  Psychiatric:        Mood and Affect: Mood normal.        Behavior: Behavior normal.   Patient given informed consent, signed copy in the chart, time out was performed.  Placed in lithotomy  position. Cervix viewed with speculum and colposcope after application of acetic acid.   Colposcopy adequate?  yes Acetowhite lesions?yes anterior cervix Punctation?no Mosaicism?  no Abnormal vasculature?  no Biopsies?11 and 12 ECC?yes  COMMENTS: Patient was given post procedure instructions.    Data Reviewed Pap result  Assessment    Procedure Details  The risks and benefits of the procedure and Written informed consent obtained.  Speculum placed in vagina and excellent visualization of cervix achieved, cervix swabbed x 3 with acetic acid solution.    Complications: none.     Plan    Specimens labelled and sent to Pathology. Suspect LSIL and repeat pap in 12 months      Scheryl Darter 09/30/2022, 2:58 PM

## 2022-10-05 LAB — SURGICAL PATHOLOGY

## 2023-01-11 ENCOUNTER — Encounter: Payer: Self-pay | Admitting: Advanced Practice Midwife

## 2023-01-11 ENCOUNTER — Ambulatory Visit (INDEPENDENT_AMBULATORY_CARE_PROVIDER_SITE_OTHER): Payer: 59 | Admitting: Advanced Practice Midwife

## 2023-01-11 VITALS — BP 118/80 | HR 76 | Ht 69.0 in | Wt 180.0 lb

## 2023-01-11 DIAGNOSIS — R102 Pelvic and perineal pain: Secondary | ICD-10-CM | POA: Diagnosis not present

## 2023-01-11 DIAGNOSIS — R87612 Low grade squamous intraepithelial lesion on cytologic smear of cervix (LGSIL): Secondary | ICD-10-CM

## 2023-01-11 NOTE — Progress Notes (Signed)
Pt c/o pelvic pain for 1 week and irregular periods

## 2023-01-11 NOTE — Progress Notes (Signed)
   GYNECOLOGY PROGRESS NOTE  History:  47 y.o. [id number] presents to [facility] office today for problem gyn visit. She reports intermittent sharp lower abdomin pain on the left side x 1 month. She also had 2 periods this month, which is unusual.   She denies h/a, dizziness, shortness of breath, n/v, or fever/chills.    The following portions of the patient's history were reviewed and updated as appropriate: allergies, current medications, past family history, past medical history, past social history, past surgical history and problem list. Last pap smear on 08/02/22 was abnormal, LSIL with positive HPV, negative 16/18/45. Colpo with CIN 1. Repeat in 1 year per [facility].  Health Maintenance Due  Topic Date Due   DTaP/Tdap/Td (1 - Tdap) Never done   Colonoscopy  Never done   INFLUENZA VACCINE  10/14/2022   COVID-19 Vaccine (1 - 2023-24 season) Never done     Review of Systems:  Pertinent items are noted in HPI.   Objective:  Physical Exam Blood pressure 118/80, pulse 76, height 5\' 9"  (1.753 m), weight 180 lb (81.6 kg), last menstrual period 01/09/2023. VS reviewed, nursing note reviewed,  Constitutional: well developed, well nourished, no distress HEENT: normocephalic CV: normal rate Pulm/chest wall: normal effort Breast Exam: deferred Abdomen: soft Neuro: alert and oriented x 3 Skin: warm, dry Psych: affect normal Pelvic exam: Cervix pink, visually closed, without lesion, scant white creamy discharge, vaginal walls and external genitalia normal Bimanual exam: Cervix 0/long/high, firm, anterior, neg CMT, uterus nontender, nonenlarged, adnexa without tenderness, enlargement, or mass  Assessment & Plan:  1. 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 Acute pelvic pain, female --Possible anovulatory cycle with irregular menses, will evaluate for cyst or other structural cause for pain.    - US PELVIC COMPLETE WITH TRANSVAGINAL; Future   Return for Follow up based on [place] results.   Sharen Counter, CNM 5:16 PM

## 2023-01-20 ENCOUNTER — Ambulatory Visit (HOSPITAL_BASED_OUTPATIENT_CLINIC_OR_DEPARTMENT_OTHER): Payer: 59

## 2023-01-24 ENCOUNTER — Ambulatory Visit (HOSPITAL_BASED_OUTPATIENT_CLINIC_OR_DEPARTMENT_OTHER)
Admission: RE | Admit: 2023-01-24 | Discharge: 2023-01-24 | Disposition: A | Discharge status: Home / Self Care | Payer: 59 | Source: Ambulatory Visit | Attending: Advanced Practice Midwife | Admitting: Advanced Practice Midwife

## 2023-01-24 DIAGNOSIS — R102 Pelvic and perineal pain: Secondary | ICD-10-CM | POA: Insufficient documentation

## 2023-03-14 IMAGING — MG MM BREAST BX W/ LOC DEV 1ST LESION IMAGE BX SPEC STEREO GUIDE*R*
8 of 12 series · 8 of 24 positions shown · non-contrast
Comparison: Previous exams.
COMPARISON: Previous exams.

Addendum:
CLINICAL DATA: Patient presents for stereotactic core needle biopsy
of a right breast asymmetry.

EXAM:
RIGHT BREAST STEREOTACTIC CORE NEEDLE BIOPSY

[R (1 of 8)]
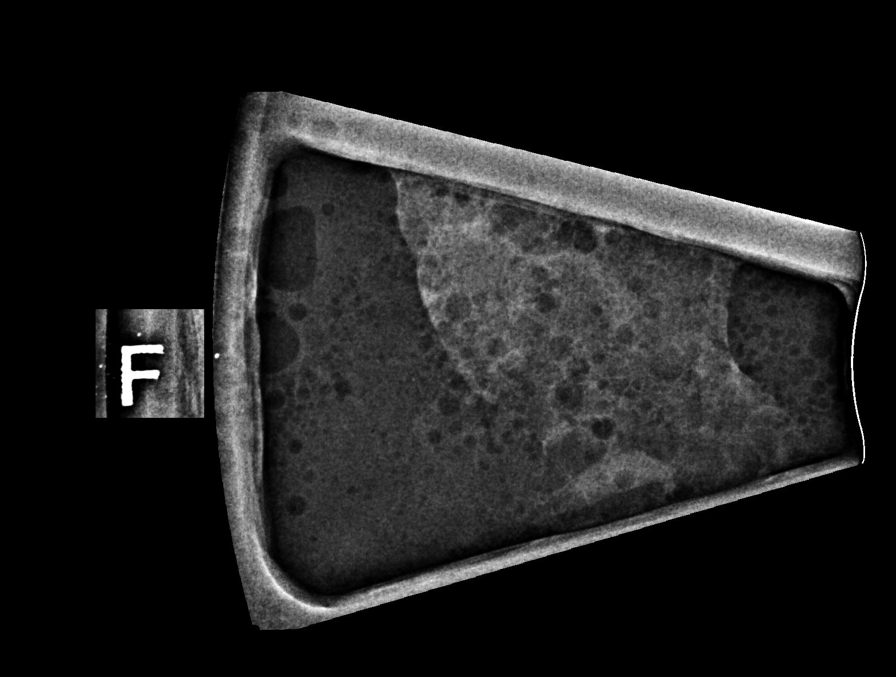

[R (2 of 8)]
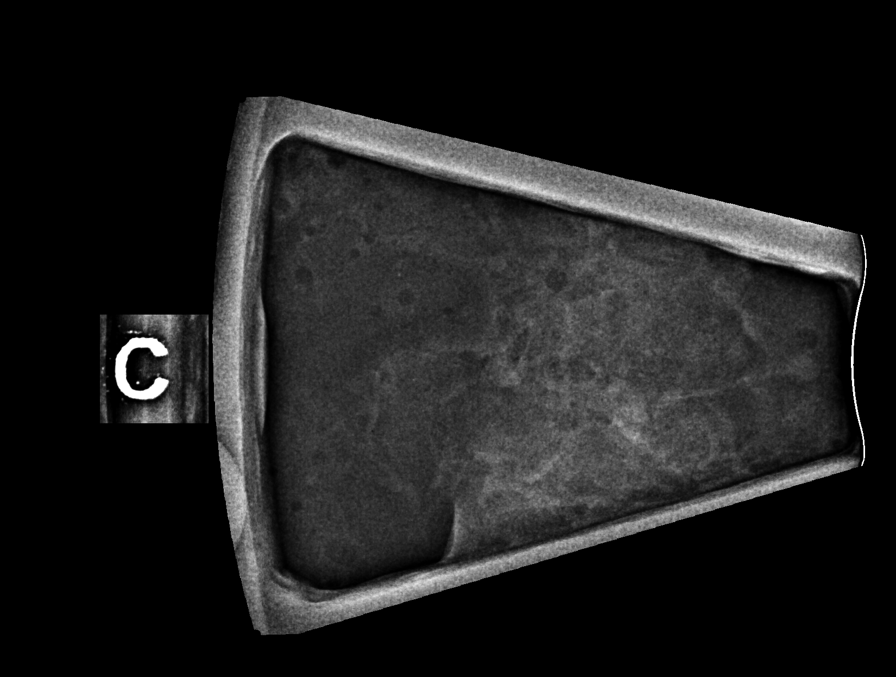

[R (3 of 8)]
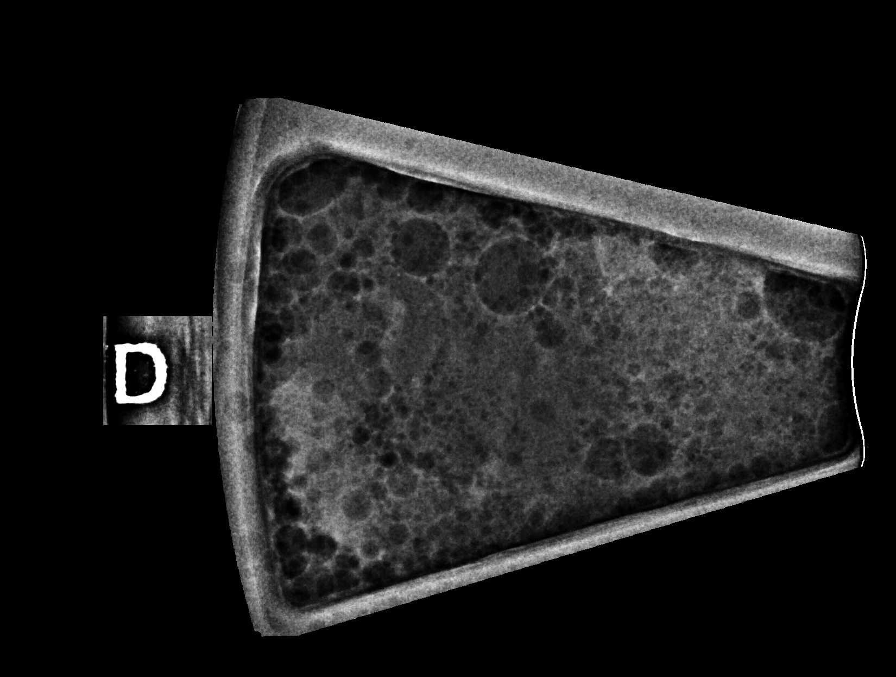

[R (4 of 8)]
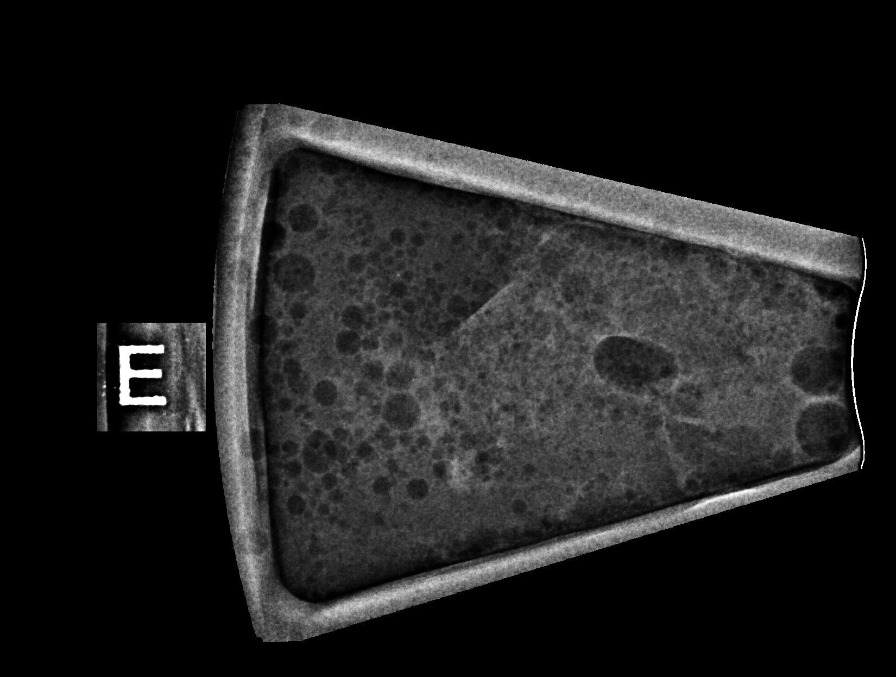

[R (5 of 8)]
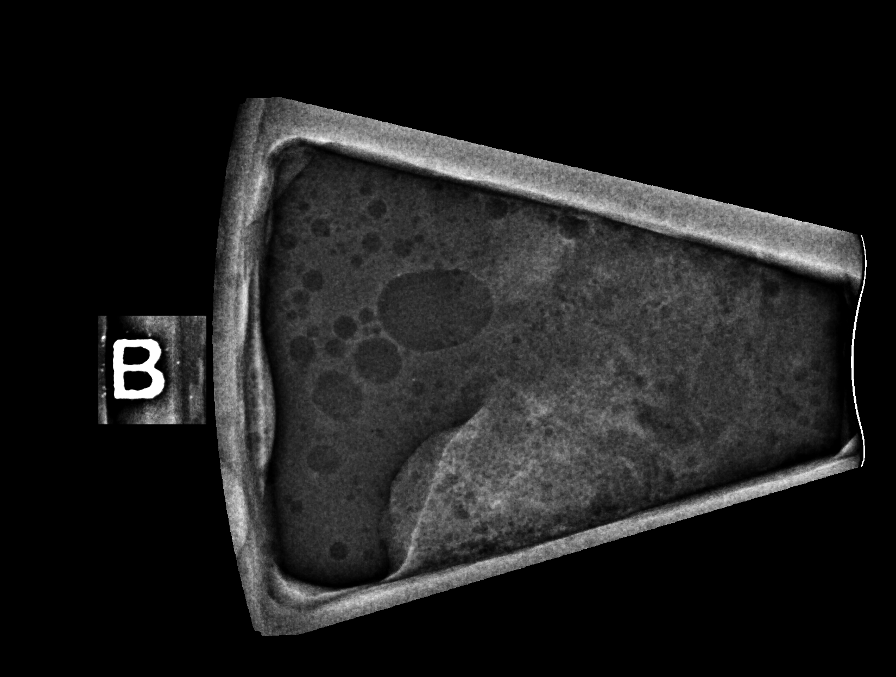

[R (6 of 8)]
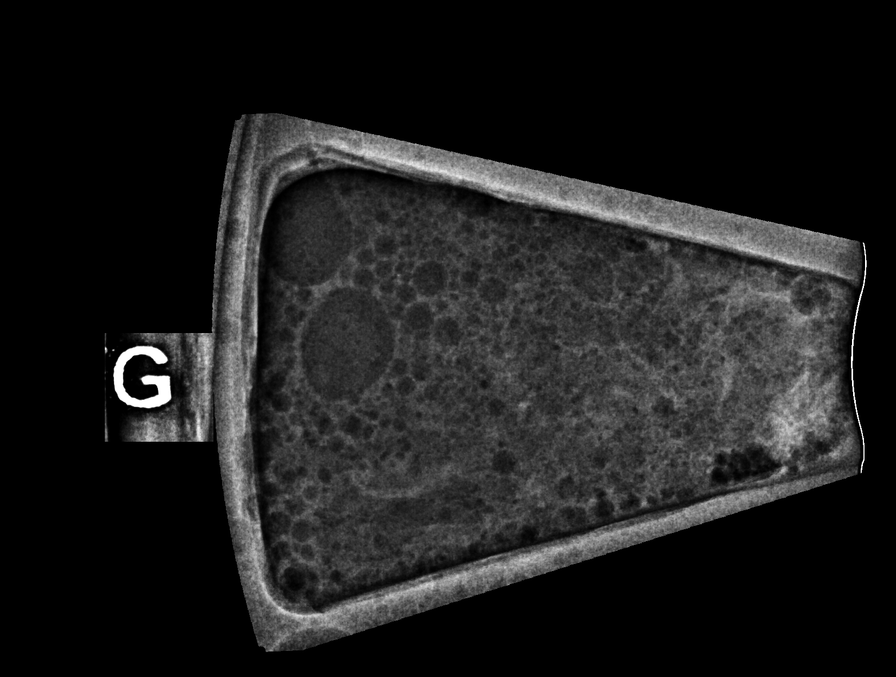

[R (7 of 8)]
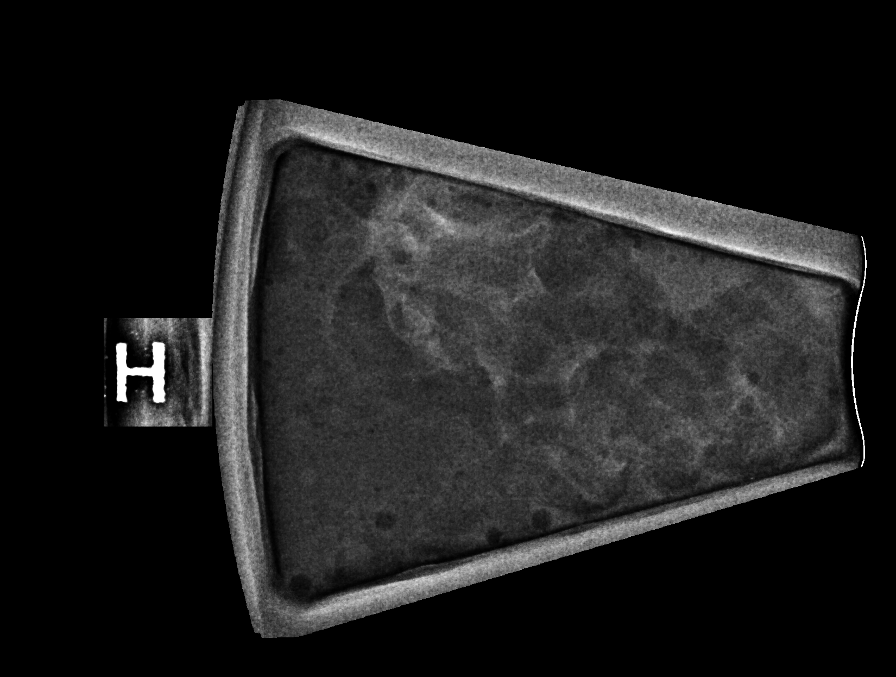

[R (8 of 8)]
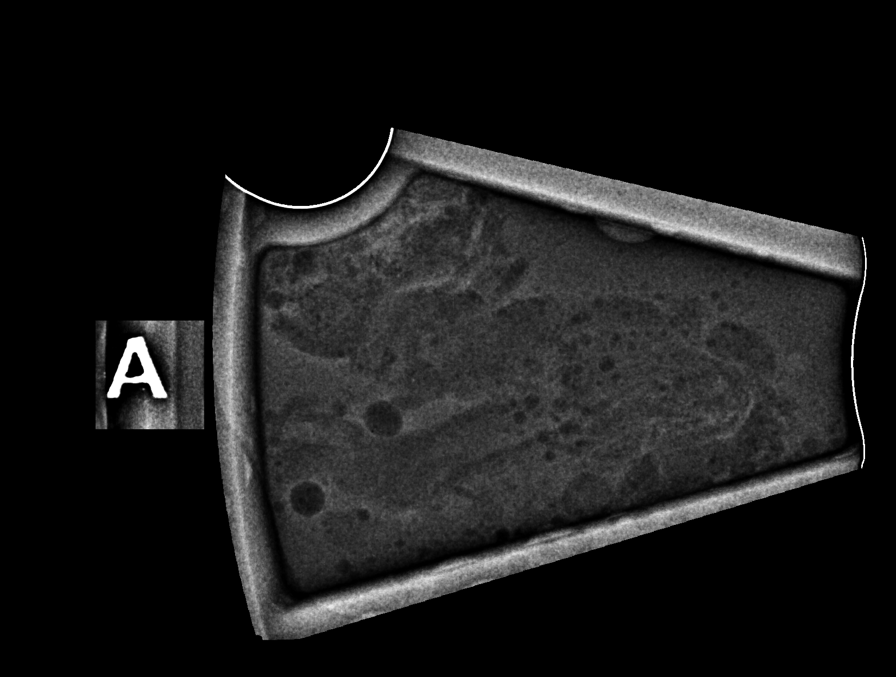

[8 of 24 positions shown; findings below may reference images not displayed]



Using sterile technique and 1% Lidocaine as local anesthetic, under
stereotactic guidance, a 9 gauge vacuum assisted device was used to
perform core needle biopsy of the asymmetry in the far upper outer
quadrant right breast using a MLO approach.

Lesion quadrant: Upper outer quadrant

At the conclusion of the procedure, a coil shaped tissue marker clip
was deployed into the biopsy cavity. Follow-up 2-view mammogram was
performed and dictated separately.
IMPRESSION: Stereotactic-guided biopsy of a right breast asymmetry. No apparent
complications.

ADDENDUM:
Pathology revealed FIBROADENOMATOID CHANGES AND PSEUDOANGIOMATOUS
STROMAL HYPERPLASIA of the RIGHT breast, upper outer quadrant, (coil
clip). This was found to be concordant by Dr. Janchiv Zedug.

Pathology results were discussed with the patient by telephone. The
patient reported doing well after the biopsy with tenderness at the
site. Post biopsy instructions and care were reviewed and questions
were answered. The patient was encouraged to call The [REDACTED]

The patient was instructed to return for annual screening
mammography.

Pathology results reported by Nandehamo Leevi, RN on 07/09/2021.



Using sterile technique and 1% Lidocaine as local anesthetic, under
stereotactic guidance, a 9 gauge vacuum assisted device was used to
perform core needle biopsy of the asymmetry in the far upper outer
quadrant right breast using a MLO approach.

Lesion quadrant: Upper outer quadrant

At the conclusion of the procedure, a coil shaped tissue marker clip
was deployed into the biopsy cavity. Follow-up 2-view mammogram was
performed and dictated separately.
IMPRESSION: Stereotactic-guided biopsy of a right breast asymmetry. No apparent
complications.

## 2023-03-14 IMAGING — MG MM BREAST LOCALIZATION CLIP
2 series · 3 of 6 positions shown · non-contrast
Comparison: Previous exam(s).

CLINICAL DATA: Evaluate post biopsy marker clip placement following
stereotactic core needle biopsy of a right breast asymmetry.

EXAM:
3D DIAGNOSTIC RIGHT MAMMOGRAM POST STEREOTACTIC BIOPSY

[R MLO synth-2D]
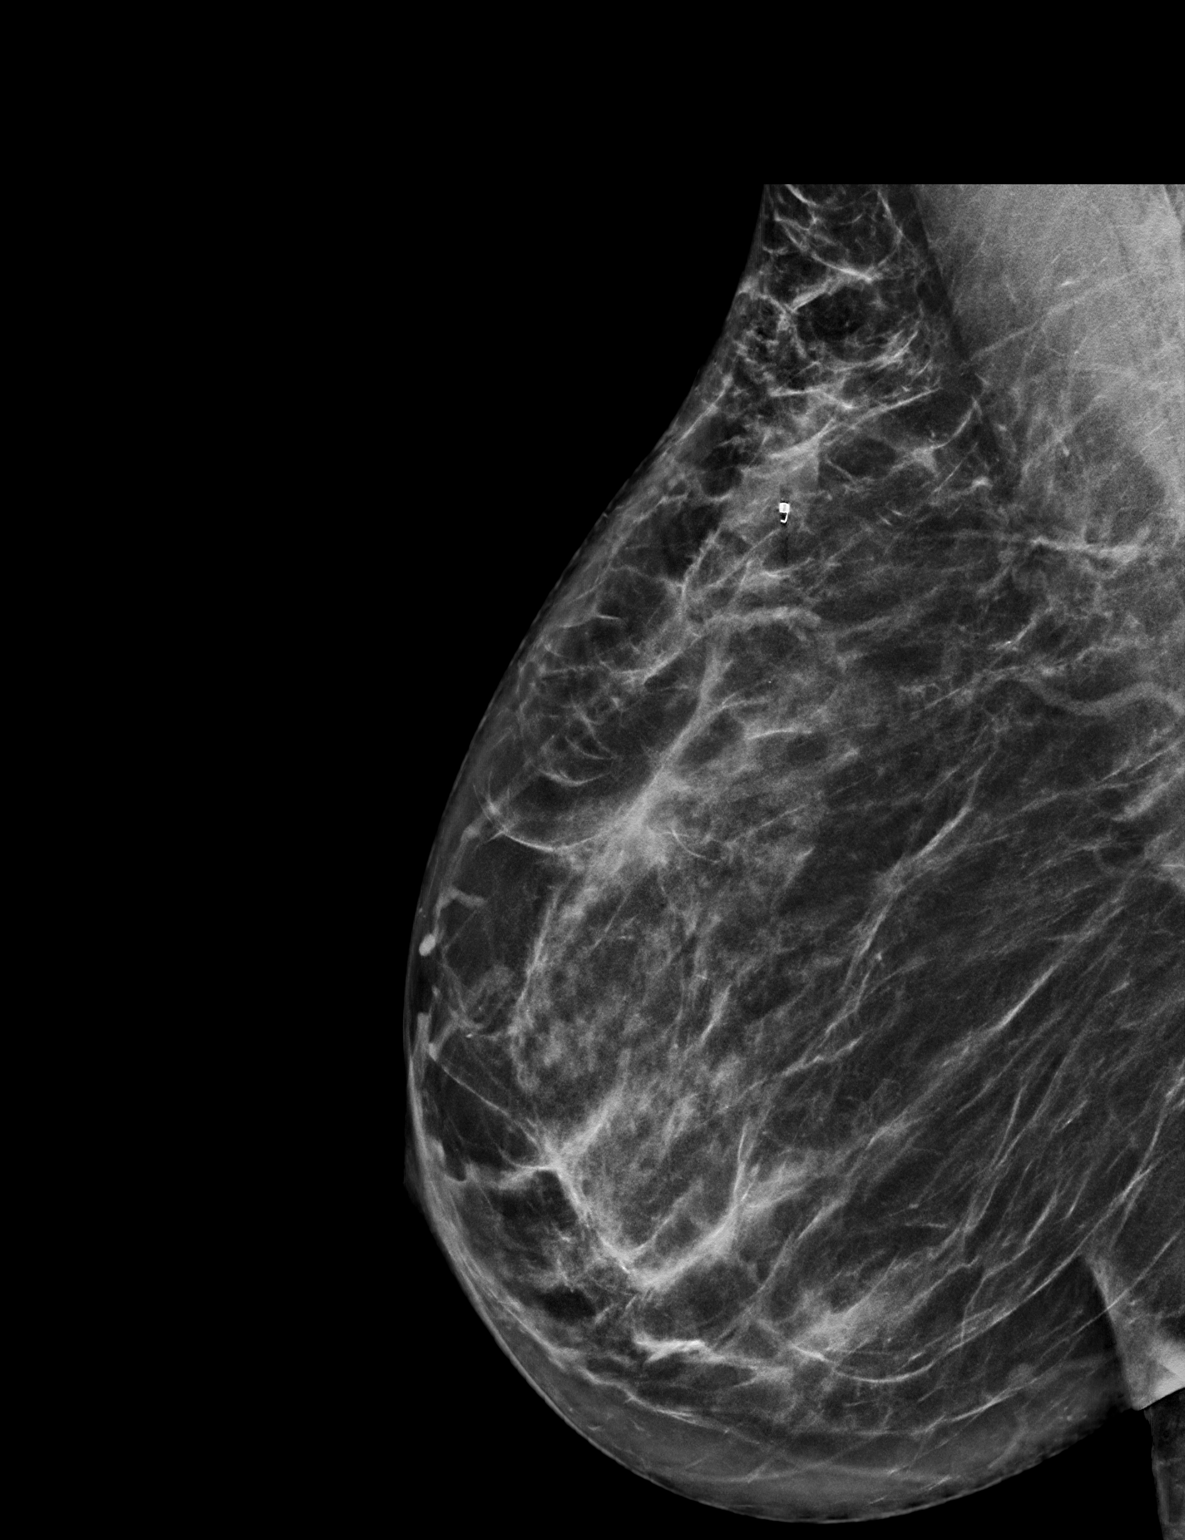

[R MLO tomo · 2 of 73 frames shown]
[frame 24/73]
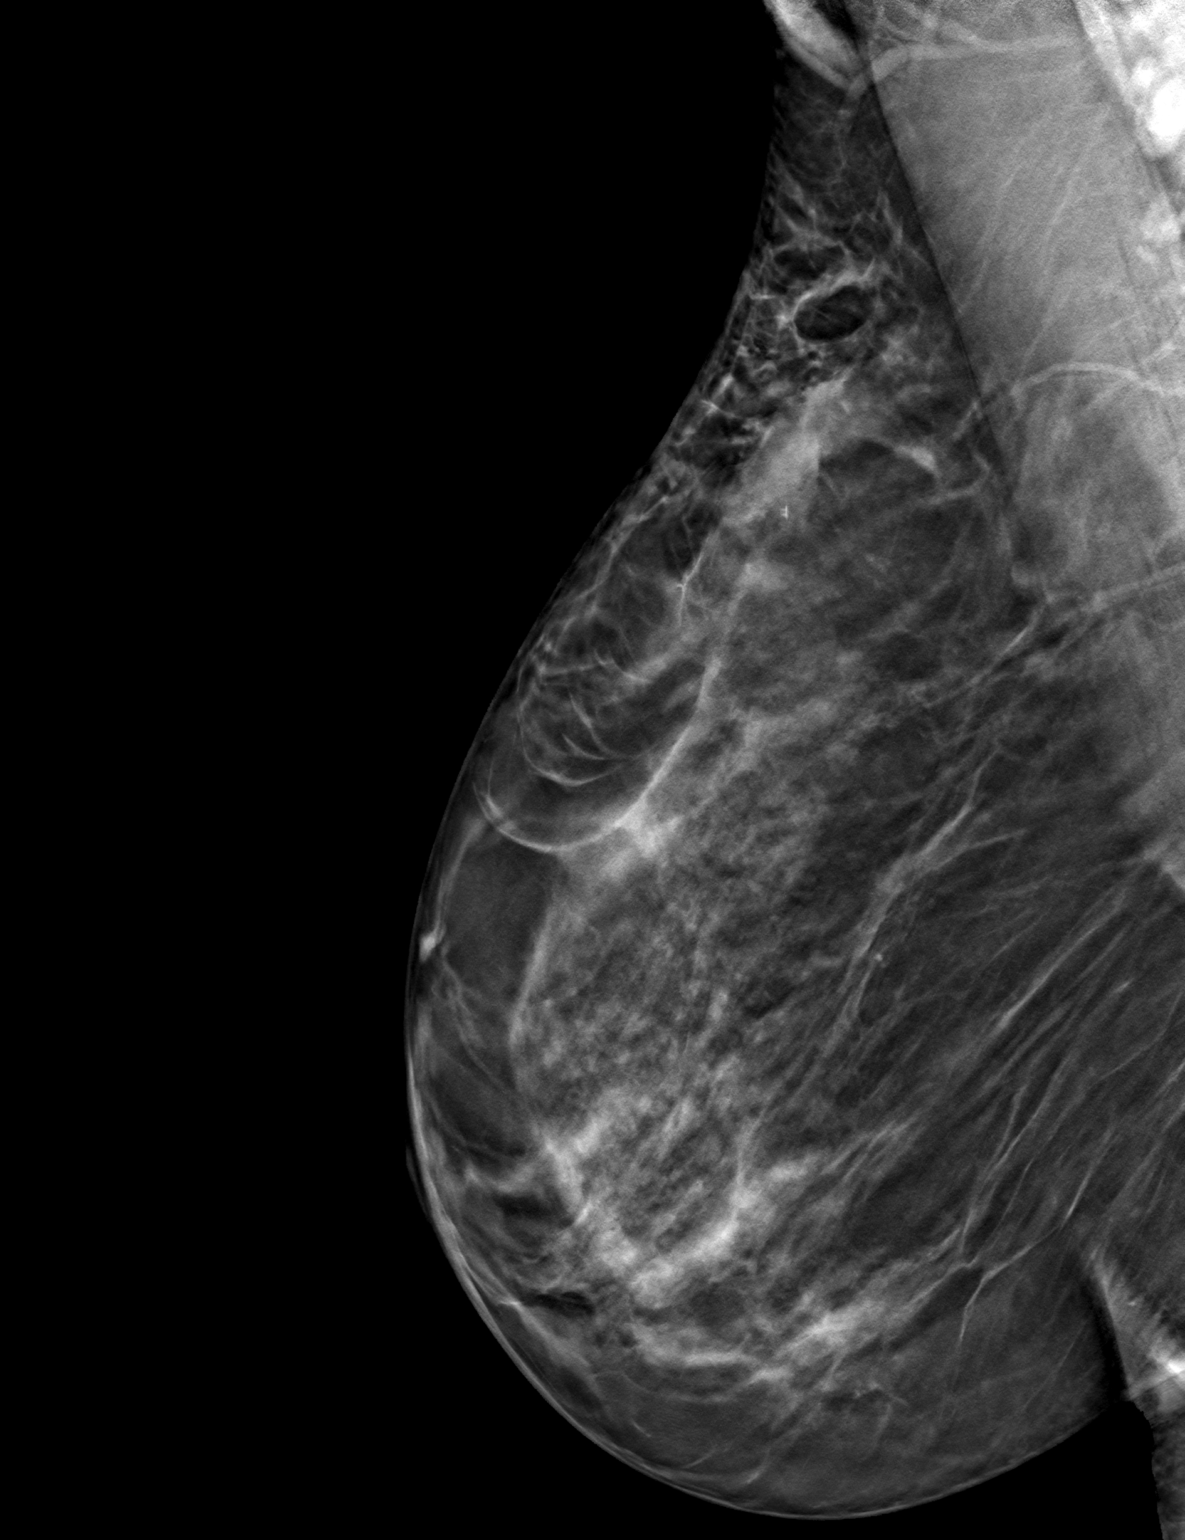
[frame 37/73]
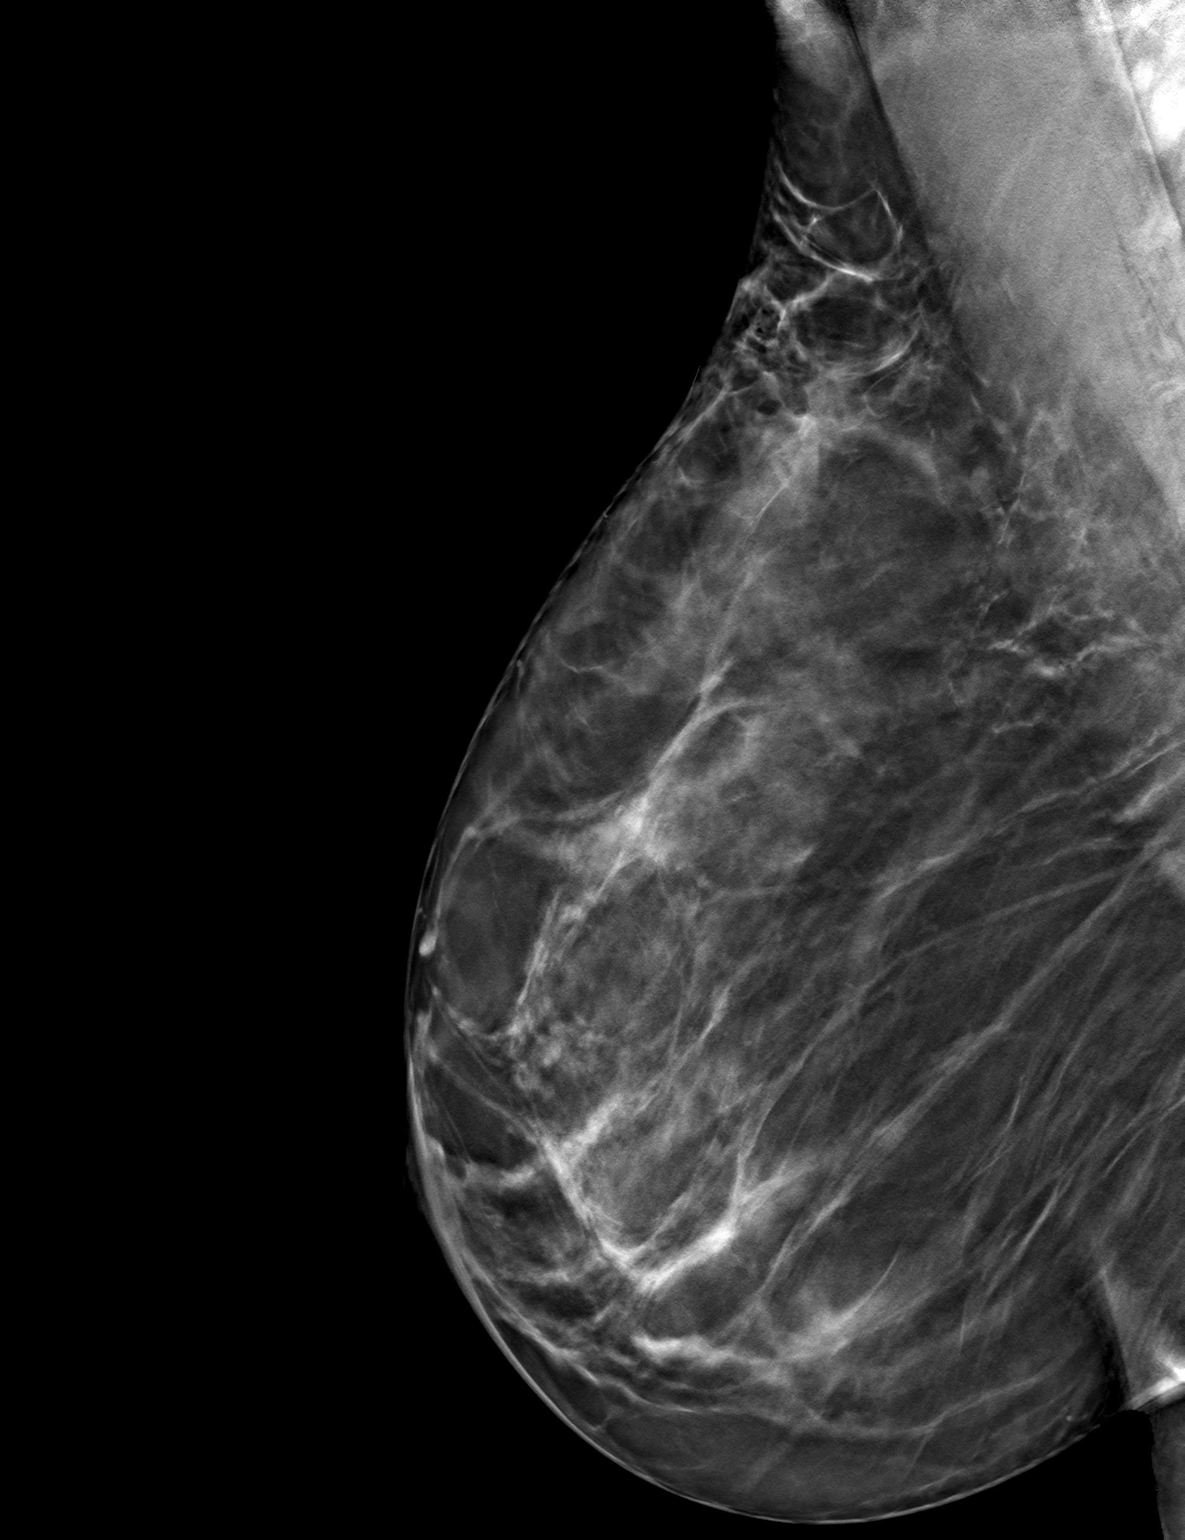

[3 of 6 positions shown; findings below may reference images not displayed]

FINDINGS: 3D Mammographic images were obtained following stereotactic guided
biopsy of a right breast asymmetry. The biopsy marking clip is in
expected position at the site of biopsy.
IMPRESSION: Appropriate positioning of the coil shaped biopsy marking clip at
the site of biopsy in the far upper outer right breast near the
axillary tail, in the expected location of the asymmetry. There is a
small post biopsy hematoma surrounding the coil clip.

Final Assessment: Post Procedure Mammograms for Marker Placement

## 2023-09-26 ENCOUNTER — Ambulatory Visit: Admitting: Obstetrics

## 2023-09-26 ENCOUNTER — Other Ambulatory Visit (HOSPITAL_COMMUNITY)
Admission: RE | Admit: 2023-09-26 | Discharge: 2023-09-26 | Disposition: A | Discharge status: Home / Self Care | Source: Ambulatory Visit | Attending: Obstetrics | Admitting: Obstetrics

## 2023-09-26 ENCOUNTER — Encounter: Payer: Self-pay | Admitting: Obstetrics

## 2023-09-26 VITALS — BP 119/77 | HR 66 | Ht 69.0 in | Wt 180.3 lb

## 2023-09-26 DIAGNOSIS — Z3169 Encounter for other general counseling and advice on procreation: Secondary | ICD-10-CM | POA: Diagnosis not present

## 2023-09-26 DIAGNOSIS — Z01419 Encounter for gynecological examination (general) (routine) without abnormal findings: Secondary | ICD-10-CM

## 2023-09-26 DIAGNOSIS — Z1239 Encounter for other screening for malignant neoplasm of breast: Secondary | ICD-10-CM | POA: Diagnosis not present

## 2023-09-26 DIAGNOSIS — Z3009 Encounter for other general counseling and advice on contraception: Secondary | ICD-10-CM

## 2023-09-26 MED ORDER — PNV-DHA+DOCUSATE 27-1.25-300 MG PO CAPS: 1.0000 | ORAL_CAPSULE | Freq: Every day | ORAL | 4 refills | Status: AC

## 2023-09-26 NOTE — Progress Notes (Signed)
 Subjective:        Cindy Flowers is a 48 y.o. female here for a routine exam.  Current complaints: Wants to have 1 more baby.  Has a new partner, and would like to conceive with him..    Personal health questionnaire:  Is patient Ashkenazi Jewish, have a family history of breast and/or ovarian cancer: no Is there a family history of uterine cancer diagnosed at age < 62, gastrointestinal cancer, urinary tract cancer, family member who is a Personnel officer syndrome-associated carrier: no Is the patient overweight and hypertensive, family history of diabetes, personal history of gestational diabetes, preeclampsia or PCOS: no Is patient over 31, have PCOS,  family history of premature CHD under age 35, diabetes, smoke, have hypertension or peripheral artery disease:  no At any time, has a partner hit, kicked or otherwise hurt or frightened you?: no Over the past 2 weeks, have you felt down, depressed or hopeless?: no Over the past 2 weeks, have you felt little interest or pleasure in doing things?:no   Gynecologic History Patient's last menstrual period was 09/26/2023 (exact date). Contraception: none Last Pap: 2024. Results were: normal Last mammogram: 2024. Results were: normal  Obstetric History OB History  Gravida Para Term Preterm AB Living  4 3 3   3   SAB IAB Ectopic Multiple Live Births          # Outcome Date GA Lbr Len/2nd Weight Sex Type Anes PTL Lv  4 Gravida           3 Term      Vag-Spont     2 Term      Vag-Spont     1 Term      Vag-Spont       Past Medical History:  Diagnosis Date   Medical history non-contributory     Past Surgical History:  Procedure Laterality Date   BREAST BIOPSY Right 06/2021   GALLBLADDER SURGERY       Current Outpatient Medications:    Prenat-FeFum-DSS-FA-DHA w/o A (PNV-DHA +DOCUSATE) 27-1.25-300 MG CAPS, Take 1 capsule by mouth daily before breakfast., Disp: 90 capsule, Rfl: 4   ferrous sulfate  325 (65 FE) MG tablet, Take 1 tablet  (325 mg total) by mouth daily with breakfast., Disp: 30 tablet, Rfl: 3 No Known Allergies  Social History   Tobacco Use   Smoking status: Never   Smokeless tobacco: Never  Substance Use Topics   Alcohol use: Never    History reviewed. No pertinent family history.    Review of Systems  Constitutional: negative for fatigue and weight loss Respiratory: negative for cough and wheezing Cardiovascular: negative for chest pain, fatigue and palpitations Gastrointestinal: negative for abdominal pain and change in bowel habits Musculoskeletal:negative for myalgias Neurological: negative for gait problems and tremors Behavioral/Psych: negative for abusive relationship, depression Endocrine: negative for temperature intolerance    Genitourinary:negative for abnormal menstrual periods, genital lesions, hot flashes, sexual problems and vaginal discharge Integument/breast: negative for breast lump, breast tenderness, nipple discharge and skin lesion(s)    Objective:       BP 119/77   Pulse 66   Ht 5' 9 (1.753 m)   Wt 180 lb 4.8 oz (81.8 kg)   LMP 09/26/2023 (Exact Date)   BMI 26.63 kg/m  General:   ALERT AND NO DISTRESS  Skin:   no rash or abnormalities  Lungs:   clear to auscultation bilaterally  Heart:   regular rate and rhythm, S1, S2 normal, no murmur, click, rub  or gallop  Breasts:   normal without suspicious masses, skin or nipple changes or axillary nodes  Abdomen:  normal findings: no organomegaly, soft, non-tender and no hernia  Pelvis:  External genitalia: normal general appearance Urinary system: urethral meatus normal and bladder without fullness, nontender Vaginal: normal without tenderness, induration or masses Cervix: normal appearance Adnexa: normal bimanual exam Uterus: anteverted and non-tender, normal size   Lab Review Urine pregnancy test Labs reviewed yes Radiologic studies reviewed yes  I have spent a total of 20 minutes of face-to-face time, excluding  clinical staff time, reviewing notes and preparing to see patient, ordering tests and/or medications, and counseling the patient.   Assessment:    1. Encounter for gynecological examination with Papanicolaou smear of cervix (Primary) Rx: - Cytology - PAP( [facility])  2. Screening breast examination Rx: - MM Digital Screening; Future  3. Encounter for counseling regarding contraception - declines contraception.  Wants to conceive one more baby.  4. Encounter for preconception consultation - AMA and a r high risk for genetic defects - discussed the importance of pre-conception folic acid intake to prevent NTD's and promote healthy brain and spine development Rx: - Prenat-FeFum-DSS-FA-DHA w/o A (PNV-DHA +DOCUSATE) 27-1.25-300 MG CAPS; Take 1 capsule by mouth daily before breakfast.  Dispense: 90 capsule; Refill: 4      Plan:    Education reviewed: calcium supplements, depression evaluation, low fat, low cholesterol diet, safe sex/STD prevention, self breast exams, and weight bearing exercise. Mammogram ordered. Follow up in: 1 year.   Meds ordered this encounter  Medications   Prenat-FeFum-DSS-FA-DHA w/o A (PNV-DHA +DOCUSATE) 27-1.25-300 MG CAPS    Sig: Take 1 capsule by mouth daily before breakfast.    Dispense:  90 capsule    Refill:  4   Orders Placed This Encounter  Procedures   MM Digital Screening    Standing Status:   Future    Expiration Date:   09/25/2024    Reason for Exam (SYMPTOM  OR DIAGNOSIS REQUIRED):   SCREENING    Is the patient pregnant?:   No    Preferred imaging location?:   [facility]     CARLIN RONAL CENTERS, MD, FACOG Attending Obstetrician & Gynecologist, [facility] for [facility], [facility], [place] 09/26/2023

## 2023-09-28 ENCOUNTER — Ambulatory Visit
Admission: RE | Admit: 2023-09-28 | Discharge: 2023-09-28 | Disposition: A | Discharge status: Home / Self Care | Source: Ambulatory Visit | Attending: Obstetrics | Admitting: Obstetrics

## 2023-09-28 DIAGNOSIS — Z1239 Encounter for other screening for malignant neoplasm of breast: Secondary | ICD-10-CM

## 2023-09-29 ENCOUNTER — Ambulatory Visit: Payer: Self-pay | Admitting: Obstetrics

## 2023-09-29 LAB — CYTOLOGY - PAP
Adequacy: ABSENT
Comment: NEGATIVE
Comment: NEGATIVE
Comment: NEGATIVE
Diagnosis: NEGATIVE
HPV 16: NEGATIVE
HPV 18 / 45: NEGATIVE
High risk HPV: POSITIVE — AB

## 2024-03-09 ENCOUNTER — Other Ambulatory Visit: Payer: Self-pay

## 2024-03-09 ENCOUNTER — Emergency Department (HOSPITAL_BASED_OUTPATIENT_CLINIC_OR_DEPARTMENT_OTHER): Admitting: Radiology

## 2024-03-09 ENCOUNTER — Encounter (HOSPITAL_BASED_OUTPATIENT_CLINIC_OR_DEPARTMENT_OTHER): Payer: Self-pay

## 2024-03-09 ENCOUNTER — Emergency Department (HOSPITAL_BASED_OUTPATIENT_CLINIC_OR_DEPARTMENT_OTHER)
Admission: EM | Admit: 2024-03-09 | Discharge: 2024-03-10 | Disposition: A | Discharge status: Home / Self Care | Attending: Emergency Medicine | Admitting: Emergency Medicine

## 2024-03-09 DIAGNOSIS — R079 Chest pain, unspecified: Secondary | ICD-10-CM | POA: Insufficient documentation

## 2024-03-09 DIAGNOSIS — R0789 Other chest pain: Secondary | ICD-10-CM

## 2024-03-09 LAB — BASIC METABOLIC PANEL WITH GFR
Anion gap: 10 (ref 5–15)
BUN: 13 mg/dL (ref 6–20)
CO2: 25 mmol/L (ref 22–32)
Calcium: 9.7 mg/dL (ref 8.9–10.3)
Chloride: 102 mmol/L (ref 98–111)
Creatinine, Ser: 0.56 mg/dL (ref 0.44–1.00)
GFR, Estimated: >60 mL/min
Glucose, Bld: 96 mg/dL (ref 70–99)
Potassium: 4 mmol/L (ref 3.5–5.1)
Sodium: 137 mmol/L (ref 135–145)

## 2024-03-09 LAB — TROPONIN T, HIGH SENSITIVITY
Troponin T High Sensitivity: <15 ng/L (ref 0–19)
Troponin T High Sensitivity: <15 ng/L (ref 0–19)

## 2024-03-09 LAB — CBC
HCT: 36.1 % (ref 36.0–46.0)
Hemoglobin: 12.1 g/dL (ref 12.0–15.0)
MCH: 28.9 pg (ref 26.0–34.0)
MCHC: 33.5 g/dL (ref 30.0–36.0)
MCV: 86.2 fL (ref 80.0–100.0)
Platelets: 249 K/uL (ref 150–400)
RBC: 4.19 MIL/uL (ref 3.87–5.11)
RDW: 12.6 % (ref 11.5–15.5)
WBC: 7.8 K/uL (ref 4.0–10.5)
nRBC: 0 % (ref 0.0–0.2)

## 2024-03-09 NOTE — ED Provider Notes (Signed)
 " [name] EMERGENCY DEPARTMENT AT [facility] Provider Note   CSN: [id number] Arrival date & time: 03/09/24  1618     Patient presents with: Chest Pain   Cindy Flowers is a 48 y.o. female.   Patient is here for evaluation of left-sided chest pain that radiates into her upper back.  This pain began earlier today.  She did some stretches and was evaluated by EMS who recommended she come in for further evaluation.  Patient reports the pain markedly reduced within 1 hour.  She has minimal pain at this time.  She has never experienced pain like this in the past.  She denies any shortness of breath, dizziness, syncope.  She denies recent fevers, cough, congestion, abdominal pain, vomiting or nausea.  She denies any recent heavy lifting or strenuous exertion.  Denies any recent leg swelling or pain.  Denies any recent injury or falls.  The history is provided by the patient.  Chest Pain Pain location:  L chest      Prior to Admission medications  Medication Sig Start Date End Date Taking? Authorizing Provider  ferrous sulfate  325 (65 FE) MG tablet Take 1 tablet (325 mg total) by mouth daily with breakfast. 01/12/19 01/11/23  Fair, Mitzie SAILOR, MD  Prenat-FeFum-DSS-FA-DHA w/o A (PNV-DHA +DOCUSATE) 27-1.25-300 MG CAPS Take 1 capsule by mouth daily before breakfast. 09/26/23   Rudy Carlin LABOR, MD    Allergies: Patient has no known allergies.    Review of Systems  Cardiovascular:  Positive for chest pain.    Updated Vital Signs BP (!) 152/88 (BP Location: Right Arm)   Pulse 68   Temp 98.1 F (36.7 C) (Oral)   Resp 18   Ht 5' 8 (1.727 m)   Wt 77.1 kg   SpO2 100%   BMI 25.85 kg/m   Physical Exam Vitals and nursing note reviewed.  Constitutional:      General: She is not in acute distress.    Appearance: Normal appearance. She is well-developed and normal weight. She is not ill-appearing or toxic-appearing.  HENT:     Head: Normocephalic and atraumatic.      Nose: Nose normal. No congestion or rhinorrhea.     Mouth/Throat:     Mouth: Mucous membranes are moist.  Eyes:     General: No scleral icterus.    Extraocular Movements: Extraocular movements intact.     Conjunctiva/sclera: Conjunctivae normal.  Cardiovascular:     Rate and Rhythm: Normal rate and regular rhythm.     Pulses: Normal pulses.     Heart sounds: Normal heart sounds. No murmur heard. Pulmonary:     Effort: Pulmonary effort is normal. No respiratory distress.     Breath sounds: Normal breath sounds. No stridor. No wheezing, rhonchi or rales.  Chest:     Chest wall: No deformity, tenderness, crepitus or edema.  Abdominal:     General: Abdomen is flat. Bowel sounds are normal. There is no distension.     Palpations: Abdomen is soft.     Tenderness: There is no abdominal tenderness. There is no guarding.  Musculoskeletal:        General: Normal range of motion.     Cervical back: Normal range of motion.     Right lower leg: No tenderness. No edema.     Left lower leg: No tenderness. No edema.  Skin:    General: Skin is warm and dry.     Capillary Refill: Capillary refill takes less than 2 seconds.  Coloration: Skin is not jaundiced or pale.  Neurological:     Mental Status: She is alert and oriented to person, place, and time.     (all labs ordered are listed, but only abnormal results are displayed) Labs Reviewed  BASIC METABOLIC PANEL WITH GFR  CBC  PREGNANCY, URINE  TROPONIN T, HIGH SENSITIVITY  TROPONIN T, HIGH SENSITIVITY    EKG: None  Radiology: DG Chest 2 View Result Date: 03/09/2024 EXAM: 2 VIEW(S) XRAY OF THE CHEST 03/09/2024 04:40:55 PM COMPARISON: Chest x-ray dated 07/27/2022. CLINICAL HISTORY: chest pain FINDINGS: LUNGS AND PLEURA: No focal pulmonary opacity. No pleural effusion. No pneumothorax. HEART AND MEDIASTINUM: No acute abnormality of the cardiac and mediastinal silhouettes. BONES AND SOFT TISSUES: No acute osseous abnormality.  IMPRESSION: 1. No acute cardiopulmonary pathology. Electronically signed by: Greig Pique MD 03/09/2024 05:47 PM EST RP Workstation: [id number]     Procedures   Medications Ordered in the ED - No data to display    Patient presents to the ED for concern of chest pain, this involves an extensive number of treatment options, and is a complaint that carries with it a high risk of complications and morbidity.  The differential diagnosis includes ACS, PE, pneumonia, musculoskeletal, anxiety.  PERC negative so low suspicion for PE. Troponin and EKG normal lowering suspicion for ACS.  No evidence of pneumonia and no recent viral URI symptoms so low suspicion for pneumonia.  Lab Tests:  I Ordered, and personally interpreted labs.  The pertinent results include: Lab work is unremarkable including normal troponin.   Imaging Studies ordered:  I ordered imaging studies including chest x-ray I independently visualized and interpreted imaging which showed no acute cardiopulmonary findings I agree with the radiologist interpretation   Cardiac Monitoring:  The patient was maintained on a cardiac monitor.  I personally viewed and interpreted the cardiac monitored which showed an underlying rhythm of: Normal sinus rhythm with no evidence of STEMI.   Problem List / ED Course:     Left-sided chest pain.  No acute findings on workup today.  This could be musculoskeletal or anxiety related.  Stable for discharge.   Reevaluation:  After the interventions noted above, I reevaluated the patient and found that they have :improved   Dispostion:  After consideration of the diagnostic results and the patients response to treatment, I feel that the patent would benefit from supportive care in the home setting and continued monitoring of symptoms with close follow up with PCP. Return precautions given.    Medical Decision Making Amount and/or Complexity of Data Reviewed Labs: ordered. Radiology:  ordered.   This note was produced using Electronics Engineer. While the provider has reviewed and verified all clinical information, transcription errors may remain.    Final diagnoses:  None    ED Discharge Orders     None          Rosina Almarie LABOR, PA-C 03/10/24 0102  "

## 2024-03-09 NOTE — ED Triage Notes (Signed)
 Pt reports chest pain radiating to back around 1:30 this afternoon. Pt reports cp and sob worse on movement and inspiration.

## 2024-03-09 NOTE — Discharge Instructions (Signed)
 It was a pleasure meeting with you today.  As we discussed your lab work, imaging, and EKG were normal.  Continue to monitor symptoms and go to your scheduled primary care visit on Tuesday.  Please return if symptoms return, or you develop shortness of breath, dizziness, persistent vomiting, confusion, loss of consciousness, or any other new or concerning symptoms.

## 2024-03-09 NOTE — ED Provider Notes (Incomplete)
" °  [place] EMERGENCY DEPARTMENT AT [facility] Provider Note   CSN: [id number] Arrival date & time: 03/09/24  1618     Patient presents with: Chest Pain   Cindy Flowers is a 48 y.o. female.  {Add pertinent medical, surgical, social history, OB history to HPI:32947}  Chest Pain      Prior to Admission medications  Medication Sig Start Date End Date Taking? Authorizing Provider  ferrous sulfate  325 (65 FE) MG tablet Take 1 tablet (325 mg total) by mouth daily with breakfast. 01/12/19 01/11/23  Fair, Mitzie SAILOR, MD  Prenat-FeFum-DSS-FA-DHA w/o A (PNV-DHA +DOCUSATE) 27-1.25-300 MG CAPS Take 1 capsule by mouth daily before breakfast. 09/26/23   Rudy Carlin LABOR, MD    Allergies: Patient has no known allergies.    Review of Systems  Cardiovascular:  Positive for chest pain.    Updated Vital Signs BP (!) 152/88 (BP Location: Right Arm)   Pulse 68   Temp 98.1 F (36.7 C) (Oral)   Resp 18   Ht 5' 8 (1.727 m)   Wt 77.1 kg   SpO2 100%   BMI 25.85 kg/m   Physical Exam  (all labs ordered are listed, but only abnormal results are displayed) Labs Reviewed  BASIC METABOLIC PANEL WITH GFR  CBC  PREGNANCY, URINE  TROPONIN T, HIGH SENSITIVITY  TROPONIN T, HIGH SENSITIVITY    EKG: None  Radiology: DG Chest 2 View Result Date: 03/09/2024 EXAM: 2 VIEW(S) XRAY OF THE CHEST 03/09/2024 04:40:55 PM COMPARISON: Chest x-ray dated 07/27/2022. CLINICAL HISTORY: chest pain FINDINGS: LUNGS AND PLEURA: No focal pulmonary opacity. No pleural effusion. No pneumothorax. HEART AND MEDIASTINUM: No acute abnormality of the cardiac and mediastinal silhouettes. BONES AND SOFT TISSUES: No acute osseous abnormality. IMPRESSION: 1. No acute cardiopulmonary pathology. Electronically signed by: Greig Pique MD 03/09/2024 05:47 PM EST RP Workstation: [id number]    {Document cardiac monitor, telemetry assessment procedure when appropriate:32947} Procedures   Medications Ordered in  the ED - No data to display    {Click here for ABCD2, HEART and other calculators REFRESH Note before signing:1}                              Medical Decision Making Amount and/or Complexity of Data Reviewed Labs: ordered. Radiology: ordered.   ***  {Document critical care time when appropriate  Document review of labs and clinical decision tools ie CHADS2VASC2, etc  Document your independent review of radiology images and any outside records  Document your discussion with family members, caretakers and with consultants  Document social determinants of health affecting pt's care  Document your decision making why or why not admission, treatments were needed:32947:::1}   Final diagnoses:  None    ED Discharge Orders     None        "

## 2024-03-10 NOTE — ED Notes (Signed)
 Reviewed discharge instructions and follow-up care with pt. Pt verbalized understanding and had no further questions. Pt exited ED without complications.
